# Patient Record
Sex: Male | Born: 1975 | Race: White | Hispanic: No | Smoking: Former smoker
Health system: Southern US, Community
[De-identification: ages and names within clinical notes are randomized; demographics above are authoritative.]

## PROBLEM LIST (undated history)

## (undated) DIAGNOSIS — K219 Gastro-esophageal reflux disease without esophagitis: Secondary | ICD-10-CM

## (undated) DIAGNOSIS — F419 Anxiety disorder, unspecified: Secondary | ICD-10-CM

## (undated) DIAGNOSIS — U071 COVID-19: Secondary | ICD-10-CM

## (undated) HISTORY — PX: HERNIA REPAIR: SHX51

---

## 2007-12-06 ENCOUNTER — Ambulatory Visit (HOSPITAL_BASED_OUTPATIENT_CLINIC_OR_DEPARTMENT_OTHER): Admission: RE | Admit: 2007-12-06 | Discharge: 2007-12-06 | Payer: Self-pay | Admitting: General Surgery

## 2010-12-23 NOTE — Op Note (Signed)
NAMEKEENA, HEESCH                 ACCOUNT NO.:  [id number]   MEDICAL RECORD NO.:  [id number]          PATIENT TYPE:  AMB   LOCATION:  DSC                          FACILITY:  [facility]   PHYSICIAN:  Cherylynn Ridges, M.D.    DATE OF BIRTH:  January 10, 1976   DATE OF PROCEDURE:  12/06/2007  DATE OF DISCHARGE:                               OPERATIVE REPORT   PREOPERATIVE DIAGNOSIS:  Right inguinal hernia.   POSTOPERATIVE DIAGNOSIS:  Direct right inguinal hernia.   PROCEDURE:  Right inguinal repair with mesh.   SURGEON:  Cherylynn Ridges, MD.   ANESTHESIA:  General with laryngeal airway.   ESTIMATED BLOOD LOSS:  Less than 20 mL.   COMPLICATIONS:  None.   CONDITION:  Stable.   FINDINGS:  Direct right inguinal hernia with small indirect sac.   INDICATIONS FOR OPERATION:  The patient is a 35 year old with  symptomatic right inguinal hernia who comes in now for repair.   OPERATION:  The patient was taken to the operating room, placed on table  in supine position.  After an adequate general laryngeal airway  anesthetic was administered, he was prepped and draped in usual sterile  manner exposing the right inguinal area.   A transverse curvilinear incision at the level of superficial ring was  made approximately 4.5-5 cm long with a #10 blade taken down to the  subcutaneous tissue.  Subcutaneous venous structures were ligated with 3-  0 Vicryl ties.  We isolated the external oblique fascia, then opened the  fascia longus fibers through the superficial ring exposing the spermatic  cord.  We mobilized the spermatic cord with Penrose drain.  I determined  there was only a very small indirect sac in the anteromedial aspect  which was suture ligated with 2-0 Ethibond sutures.  The major component  of the hernia was a direct component which we imbricated on itself with  interrupted 0 Ethibond sutures.  We then implanted a piece of oval mesh  measuring 4 x 2 cm in size attaching it to the conjoint  tendon  anteromedially and reflected a portion of inguinal ligament  inferolaterally.  This was done with running 0 Prolene sutures.  The  mesh had been soaked in an antibiotic solution prior to being implanted.   Once the mesh was in place, we irrigated with antibiotic solution, then  we closed the external oblique fascia protecting ilioinguinal nerve  using a running 3-0 Vicryl suture.  The Scarpa's fascia was  reapproximated using interrupted 3-0 Vicryl sutures.  Then, the skin was  closed using a running subcuticular suture of 4-0 Monocryl.  We  injected 0.5% Marcaine without epinephrine into the subcu and set a  regional block at the anterior superior iliac spine.  A total of 80 mL  were used.  All counts were correct.  Sterile dressing was applied  including Dermabond, Steri-Strips, and Tegaderm.      Cherylynn Ridges, M.D.  Electronically Signed     JOW/MEDQ  D:  12/06/2007  T:  12/06/2007  Job:  [id number]   cc:   Darcel Bayley  Beatris Si, M.D.

## 2014-01-12 ENCOUNTER — Encounter: Payer: Self-pay | Admitting: Family Medicine

## 2019-01-18 ENCOUNTER — Encounter (HOSPITAL_BASED_OUTPATIENT_CLINIC_OR_DEPARTMENT_OTHER): Payer: Self-pay | Admitting: *Deleted

## 2019-01-18 ENCOUNTER — Emergency Department (HOSPITAL_BASED_OUTPATIENT_CLINIC_OR_DEPARTMENT_OTHER): Payer: BC Managed Care – PPO

## 2019-01-18 ENCOUNTER — Other Ambulatory Visit: Payer: Self-pay

## 2019-01-18 ENCOUNTER — Emergency Department (HOSPITAL_BASED_OUTPATIENT_CLINIC_OR_DEPARTMENT_OTHER)
Admission: EM | Admit: 2019-01-18 | Discharge: 2019-01-18 | Disposition: A | Discharge status: Home / Self Care | Payer: BC Managed Care – PPO | Attending: Emergency Medicine | Admitting: Emergency Medicine

## 2019-01-18 DIAGNOSIS — Z79899 Other long term (current) drug therapy: Secondary | ICD-10-CM | POA: Insufficient documentation

## 2019-01-18 DIAGNOSIS — U071 COVID-19: Secondary | ICD-10-CM | POA: Diagnosis not present

## 2019-01-18 DIAGNOSIS — Z20822 Contact with and (suspected) exposure to covid-19: Secondary | ICD-10-CM

## 2019-01-18 DIAGNOSIS — Z87891 Personal history of nicotine dependence: Secondary | ICD-10-CM | POA: Insufficient documentation

## 2019-01-18 DIAGNOSIS — R509 Fever, unspecified: Secondary | ICD-10-CM

## 2019-01-18 DIAGNOSIS — M7918 Myalgia, other site: Secondary | ICD-10-CM | POA: Diagnosis present

## 2019-01-18 DIAGNOSIS — R52 Pain, unspecified: Secondary | ICD-10-CM

## 2019-01-18 MED ORDER — DOXYCYCLINE HYCLATE 100 MG PO CAPS: 100.0000 mg | ORAL_CAPSULE | Freq: Two times a day (BID) | ORAL | 0 refills | Status: DC

## 2019-01-18 MED ORDER — ACETAMINOPHEN 325 MG PO TABS
ORAL_TABLET | ORAL | Status: AC
  Filled 2019-01-18: qty 2

## 2019-01-18 MED ORDER — ACETAMINOPHEN 325 MG PO TABS
650.0000 mg | ORAL_TABLET | Freq: Once | ORAL | Status: AC | PRN
  Administered 2019-01-18: 650 mg via ORAL

## 2019-01-18 NOTE — ED Notes (Signed)
Pt on monitor 

## 2019-01-18 NOTE — Discharge Instructions (Signed)
You have presented with fever and body aches.  We do not see signs of acute bacterial infection on your history or physical exam. Your XRay shows no pneumonia or fluid. Our oxygen levels are normal.  Your fever may be secondary to a tick born illness such as rocky mountain spotted fever (which can occur without rash) or secondary to viral infection including possible COVID19.  We are giving you a prescription that treats tick-born illness (and would also treat possible bacterial sinus infecdtion) and are testing you for COVID19. We recommend self-quarantine for at least 7 days and 3 days after fever.  Return if you develop worsening symptoms or other concerns.

## 2019-01-18 NOTE — ED Provider Notes (Signed)
[place] EMERGENCY DEPARTMENT Provider Note   CSN: [id number] Arrival date & time: 01/18/19  1921    History   Chief Complaint Chief Complaint  Patient presents with  . Generalized Body Aches    HPI Victor Blanchard is a 43 y.o. male.     HPI   43 year old male with no significant medical history presents with concern for chills and body aches.  Reports that he began feeling unwell last week, but over the last 2 days his injuries have worsened.  Reports sensation of full body aches and full body tingling, chills, subjective fever.  Reports temperature was 99.1 yesterday.  Is febrile in the emergency department to 102.6.  His wife had similar symptoms last week and was diagnosed with a sinus infection the antibiotics.  She did not have COVID testing at that time.  Denies cough but does have sensation of some chest congestion.  Reports mild shortness of breath.  Denies urinary symptoms, abdominal pain, vomiting, diarrhea, loss of smell or taste.  Reports pain through some of his joints and overall aching.  Also reports headache.  Denies use of IV drugs.  Did note a tick crawling on him about a week ago, but no known tick bites.  No rash.  Chronic sinus congestion, and it is difficult to tell if the symptoms are any worse.  History reviewed. No pertinent past medical history.  There are no active problems to display for this patient.   Past Surgical History:  Procedure Laterality Date  . HERNIA REPAIR          Home Medications    Prior to Admission medications   Medication Sig Start Date End Date Taking? Authorizing Provider  dicyclomine (BENTYL) 20 MG tablet TAKE 1 TABLET FOUR TIMES A DAY 30 MINUTES BEFORE A MEAL 10/18/18  Yes [provider]  montelukast (SINGULAIR) 10 MG tablet TAKE 1 TABLET BY MOUTH EVERY DAY 01/10/18  Yes [provider]  omeprazole (PRILOSEC) 20 MG capsule TAKE 1 CAPSULE BY MOUTH EVERY DAY 07/25/18  Yes [provider]   doxycycline (VIBRAMYCIN) 100 MG capsule Take 1 capsule (100 mg total) by mouth 2 (two) times daily for 14 days. 01/18/19 02/01/19  Gareth Morgan, MD  loratadine (CLARITIN) 10 MG tablet Take by mouth.    [provider]    Family History No family history on file.  Social History Social History   Tobacco Use  . Smoking status: Former Research scientist (life sciences)  . Smokeless tobacco: Never Used  Substance Use Topics  . Alcohol use: Yes  . Drug use: Yes    Types: Marijuana     Allergies   Morphine and related   Review of Systems Review of Systems  Constitutional: Positive for activity change, appetite change, chills, fatigue and fever.  HENT: Positive for congestion, postnasal drip and rhinorrhea. Negative for sore throat.   Eyes: Negative for visual disturbance.  Respiratory: Positive for shortness of breath. Negative for cough.   Cardiovascular: Negative for chest pain.  Gastrointestinal: Negative for abdominal pain, diarrhea, nausea and vomiting.  Genitourinary: Negative for difficulty urinating and dysuria.  Musculoskeletal: Positive for arthralgias and myalgias. Negative for back pain and neck stiffness.  Skin: Negative for rash.  Neurological: Positive for headaches. Negative for syncope.     Physical Exam Updated Vital Signs BP 139/86   Pulse (!) 102   Temp 99.9 F (37.7 C) (Oral)   Resp 16   Ht 6\' 2"  (1.88 m)   Wt 106.6  kg   SpO2 97%   BMI 30.17 kg/m   Physical Exam Vitals signs and nursing note reviewed.  Constitutional:      General: He is not in acute distress.    Appearance: He is well-developed. He is not diaphoretic.  HENT:     Head: Normocephalic and atraumatic.     Comments: No oropharyngeal exudate No sinus tenderness Eyes:     Conjunctiva/sclera: Conjunctivae normal.  Neck:     Musculoskeletal: Normal range of motion.  Cardiovascular:     Rate and Rhythm: Regular rhythm. Tachycardia present.     Heart sounds: Normal heart sounds. No murmur. No  friction rub. No gallop.   Pulmonary:     Effort: Pulmonary effort is normal. No respiratory distress.     Breath sounds: Normal breath sounds. No wheezing or rales.  Abdominal:     General: There is no distension.     Palpations: Abdomen is soft.     Tenderness: There is no abdominal tenderness. There is no guarding.  Skin:    General: Skin is warm and dry.  Neurological:     Mental Status: He is alert and oriented to person, place, and time.      ED Treatments / Results  Labs (all labs ordered are listed, but only abnormal results are displayed) Labs Reviewed  NOVEL CORONAVIRUS, [facility](HOSPITAL ORDER, SEND-OUT TO REF LAB)    EKG EKG Interpretation  Date/Time:  Wednesday January 18 2019 20:38:37 EDT Ventricular Rate:  104 PR Interval:    QRS Duration: 86 QT Interval:  315 QTC Calculation: 415 R Axis:   10 Text Interpretation:  Sinus tachycardia Abnormal R-wave progression, early transition No previous ECGs available Confirmed by Alvira MondaySchlossman, Shloka Baldridge ([id number]) on 01/18/2019 9:11:17 PM Also confirmed by Alvira MondaySchlossman, Ranier Coach ([id number]), editor Barbette Hairassel, Kerry [phone]([id number])  on 01/19/2019 7:06:52 AM   Radiology Dg Chest Portable 1 View  Result Date: 01/18/2019 CLINICAL DATA:  Chills and body aches EXAM: PORTABLE CHEST 1 VIEW COMPARISON:  None. FINDINGS: The heart size and mediastinal contours are within normal limits. Both lungs are clear. The visualized skeletal structures are unremarkable. IMPRESSION: No active disease. Electronically Signed   By: Katherine Mantlehristopher  Green M.D.   On: 01/18/2019 20:55    Procedures Procedures (including critical care time)  Medications Ordered in ED Medications  acetaminophen (TYLENOL) tablet 650 mg (650 mg Oral Given 01/18/19 1958)     Initial Impression / Assessment and Plan / ED Course  I have reviewed the triage vital signs and the nursing notes.  Pertinent labs & imaging results that were available during my care of the patient were reviewed by me and considered  in my medical decision making (see chart for details).        43 year old male with no significant asthma history presents with concern for body aches and chills, was found to be febrile to 102.6 on arrival to the emergency department.  Chest x-ray shows no evidence of pneumonia.  He has not had nausea, vomiting, diarrhea, and have low suspicion for significant electrolyte abnormalities that would require labwork.  Doubt meningitis as he has normal mentation, is freely flexing and extending neck and turning head without signs of nuchal rigidity.  No urinary symptoms to suggest UTI.  He does not have abdominal pain to suggest intra-abdominal cause of his fever.  No rash or history of IV drug use.  He does not have sore throat or signs to suggest strep throat on exam.  Given history of potential  tick exposures and symptoms, will empirically treat for possible tickborne illness with doxycycline for 2 weeks.  Given patient questions about strep swab and bacterial sinus disease I discussed that doxycycline would also empirically treat these although I have a very low clinical suspicion both.  Discussed given wife is sick contact having similar symptoms in the setting of COVID-19 pandemic, I high concern that his symptoms are secondary to COVID-19. Recommend self-quarantine, supportive care and discussed reasons to return.      Michelene GardenerJason Plemons was evaluated in Emergency Department on 01/18/2019 for the symptoms described in the history of present illness. He/she was evaluated in the context of the global COVID-19 pandemic, which necessitated consideration that the patient might be at risk for infection with the SARS-CoV-2 virus that causes COVID-19. Institutional protocols and algorithms that pertain to the evaluation of patients at risk for COVID-19 are in a state of rapid change based on information released by regulatory bodies including the CDC and federal and state organizations. These policies and algorithms  were followed during the patient's care in the ED.   Final Clinical Impressions(s) / ED Diagnoses   Final diagnoses:  Body aches  Fever, unspecified fever cause  Suspected Covid-19 Virus Infection  Tick exposure  ED Discharge Orders         Ordered    doxycycline (VIBRAMYCIN) 100 MG capsule  2 times daily     01/18/19 2117           Alvira MondaySchlossman, Vonnie Spagnolo, MD 01/19/19 1209

## 2019-01-18 NOTE — ED Notes (Signed)
ED Provider at bedside. 

## 2019-01-18 NOTE — ED Triage Notes (Signed)
Chills and body aches x 2 days. States his wife has similar sx. Temp 99.1 yesterday. Denies cough, but states he has some sinus drainage

## 2019-01-21 ENCOUNTER — Telehealth (HOSPITAL_BASED_OUTPATIENT_CLINIC_OR_DEPARTMENT_OTHER): Payer: Self-pay | Admitting: Emergency Medicine

## 2019-01-24 LAB — NOVEL CORONAVIRUS, NAA (HOSP ORDER, SEND-OUT TO REF LAB; TAT 18-24 HRS): SARS-CoV-2, NAA: DETECTED — AB

## 2019-01-28 ENCOUNTER — Emergency Department (HOSPITAL_COMMUNITY): Payer: BC Managed Care – PPO

## 2019-01-28 ENCOUNTER — Encounter (HOSPITAL_COMMUNITY): Payer: Self-pay | Admitting: Emergency Medicine

## 2019-01-28 ENCOUNTER — Other Ambulatory Visit: Payer: Self-pay

## 2019-01-28 ENCOUNTER — Inpatient Hospital Stay (HOSPITAL_COMMUNITY)
Admission: EM | Admit: 2019-01-28 | Discharge: 2019-02-02 | DRG: 177 | Disposition: A | Discharge status: Home / Self Care | Payer: BC Managed Care – PPO | Attending: Family Medicine | Admitting: Family Medicine

## 2019-01-28 DIAGNOSIS — U071 COVID-19: Principal | ICD-10-CM | POA: Diagnosis present

## 2019-01-28 DIAGNOSIS — E871 Hypo-osmolality and hyponatremia: Secondary | ICD-10-CM | POA: Diagnosis present

## 2019-01-28 DIAGNOSIS — J069 Acute upper respiratory infection, unspecified: Secondary | ICD-10-CM

## 2019-01-28 DIAGNOSIS — R0902 Hypoxemia: Secondary | ICD-10-CM | POA: Diagnosis not present

## 2019-01-28 DIAGNOSIS — Z87891 Personal history of nicotine dependence: Secondary | ICD-10-CM | POA: Diagnosis not present

## 2019-01-28 DIAGNOSIS — J45909 Unspecified asthma, uncomplicated: Secondary | ICD-10-CM | POA: Diagnosis not present

## 2019-01-28 DIAGNOSIS — J1289 Other viral pneumonia: Secondary | ICD-10-CM | POA: Diagnosis not present

## 2019-01-28 DIAGNOSIS — T148XXA Other injury of unspecified body region, initial encounter: Secondary | ICD-10-CM | POA: Diagnosis not present

## 2019-01-28 DIAGNOSIS — Z79899 Other long term (current) drug therapy: Secondary | ICD-10-CM | POA: Diagnosis not present

## 2019-01-28 DIAGNOSIS — Z6829 Body mass index (BMI) 29.0-29.9, adult: Secondary | ICD-10-CM

## 2019-01-28 DIAGNOSIS — E669 Obesity, unspecified: Secondary | ICD-10-CM | POA: Diagnosis not present

## 2019-01-28 DIAGNOSIS — E876 Hypokalemia: Secondary | ICD-10-CM | POA: Diagnosis present

## 2019-01-28 DIAGNOSIS — R0600 Dyspnea, unspecified: Secondary | ICD-10-CM | POA: Diagnosis present

## 2019-01-28 DIAGNOSIS — E872 Acidosis: Secondary | ICD-10-CM | POA: Diagnosis not present

## 2019-01-28 DIAGNOSIS — F1010 Alcohol abuse, uncomplicated: Secondary | ICD-10-CM | POA: Diagnosis not present

## 2019-01-28 DIAGNOSIS — W57XXXA Bitten or stung by nonvenomous insect and other nonvenomous arthropods, initial encounter: Secondary | ICD-10-CM | POA: Diagnosis present

## 2019-01-28 DIAGNOSIS — Z885 Allergy status to narcotic agent status: Secondary | ICD-10-CM | POA: Diagnosis not present

## 2019-01-28 HISTORY — DX: COVID-19: U07.1

## 2019-01-28 HISTORY — DX: Gastro-esophageal reflux disease without esophagitis: K21.9

## 2019-01-28 HISTORY — DX: Anxiety disorder, unspecified: F41.9

## 2019-01-28 LAB — COMPREHENSIVE METABOLIC PANEL
ALT: 33 U/L (ref 0–44)
AST: 32 U/L (ref 15–41)
Albumin: 3.9 g/dL (ref 3.5–5.0)
Alkaline Phosphatase: 64 U/L (ref 38–126)
Anion gap: 13 (ref 5–15)
BUN: 23 mg/dL — ABNORMAL HIGH (ref 6–20)
CO2: 19 mmol/L — ABNORMAL LOW (ref 22–32)
Calcium: 8.8 mg/dL — ABNORMAL LOW (ref 8.9–10.3)
Chloride: 99 mmol/L (ref 98–111)
Creatinine, Ser: 1.24 mg/dL (ref 0.61–1.24)
GFR calc Af Amer: >60 mL/min (ref >60)
GFR calc non Af Amer: >60 mL/min (ref >60)
Glucose, Bld: 117 mg/dL — ABNORMAL HIGH (ref 70–99)
Potassium: 3.1 mmol/L — ABNORMAL LOW (ref 3.5–5.1)
Sodium: 131 mmol/L — ABNORMAL LOW (ref 135–145)
Total Bilirubin: 1.3 mg/dL — ABNORMAL HIGH (ref 0.3–1.2)
Total Protein: 8 g/dL (ref 6.5–8.1)

## 2019-01-28 LAB — URINALYSIS, ROUTINE W REFLEX MICROSCOPIC
Bilirubin Urine: NEGATIVE
Glucose, UA: NEGATIVE mg/dL
Ketones, ur: 5 mg/dL — AB
Leukocytes,Ua: NEGATIVE
Nitrite: NEGATIVE
Protein, ur: 30 mg/dL — AB
Specific Gravity, Urine: 1.031 — ABNORMAL HIGH (ref 1.005–1.030)
pH: 5 (ref 5.0–8.0)

## 2019-01-28 LAB — LACTIC ACID, PLASMA: Lactic Acid, Venous: 1.2 mmol/L (ref 0.5–1.9)

## 2019-01-28 LAB — LACTATE DEHYDROGENASE: LDH: 270 U/L — ABNORMAL HIGH (ref 98–192)

## 2019-01-28 LAB — CBC WITH DIFFERENTIAL/PLATELET
Abs Immature Granulocytes: 0.06 10*3/uL (ref 0.00–0.07)
Basophils Absolute: 0 10*3/uL (ref 0.0–0.1)
Basophils Relative: 0 %
Eosinophils Absolute: 0 10*3/uL (ref 0.0–0.5)
Eosinophils Relative: 0 %
HCT: 43.4 % (ref 39.0–52.0)
Hemoglobin: 15.3 g/dL (ref 13.0–17.0)
Immature Granulocytes: 1 %
Lymphocytes Relative: 11 %
Lymphs Abs: 1.1 10*3/uL (ref 0.7–4.0)
MCH: 29.8 pg (ref 26.0–34.0)
MCHC: 35.3 g/dL (ref 30.0–36.0)
MCV: 84.4 fL (ref 80.0–100.0)
Monocytes Absolute: 0.7 10*3/uL (ref 0.1–1.0)
Monocytes Relative: 7 %
Neutro Abs: 8.1 10*3/uL — ABNORMAL HIGH (ref 1.7–7.7)
Neutrophils Relative %: 81 %
Platelets: 205 10*3/uL (ref 150–400)
RBC: 5.14 MIL/uL (ref 4.22–5.81)
RDW: 12.1 % (ref 11.5–15.5)
WBC: 10 10*3/uL (ref 4.0–10.5)
nRBC: 0 % (ref 0.0–0.2)

## 2019-01-28 LAB — D-DIMER, QUANTITATIVE: D-Dimer, Quant: 0.37 ug/mL-FEU (ref 0.00–0.50)

## 2019-01-28 LAB — C-REACTIVE PROTEIN: CRP: >20 mg/dL — ABNORMAL HIGH (ref <1.0)

## 2019-01-28 LAB — SEDIMENTATION RATE: Sed Rate: 53 mm/hr — ABNORMAL HIGH (ref 0–16)

## 2019-01-28 LAB — FERRITIN: Ferritin: 639 ng/mL — ABNORMAL HIGH (ref 24–336)

## 2019-01-28 MED ORDER — ZINC SULFATE 220 (50 ZN) MG PO CAPS
220.0000 mg | ORAL_CAPSULE | Freq: Every day | ORAL | Status: DC
  Administered 2019-01-29 – 2019-02-02 (×5): 220 mg via ORAL
  Filled 2019-01-28 (×7): qty 1

## 2019-01-28 MED ORDER — ACETAMINOPHEN 325 MG PO TABS
650.0000 mg | ORAL_TABLET | Freq: Four times a day (QID) | ORAL | Status: DC | PRN
  Administered 2019-01-28 – 2019-01-31 (×5): 650 mg via ORAL
  Filled 2019-01-28 (×5): qty 2

## 2019-01-28 MED ORDER — MONTELUKAST SODIUM 10 MG PO TABS
10.0000 mg | ORAL_TABLET | Freq: Every day | ORAL | Status: DC
  Administered 2019-01-28 – 2019-02-02 (×6): 10 mg via ORAL
  Filled 2019-01-28 (×6): qty 1

## 2019-01-28 MED ORDER — SODIUM CHLORIDE 0.9 % IV BOLUS
1000.0000 mL | Freq: Once | INTRAVENOUS | Status: AC
  Administered 2019-01-28: 1000 mL via INTRAVENOUS

## 2019-01-28 MED ORDER — ONDANSETRON HCL 4 MG/2ML IJ SOLN
4.0000 mg | Freq: Four times a day (QID) | INTRAMUSCULAR | Status: DC | PRN
  Administered 2019-01-29: 4 mg via INTRAVENOUS
  Filled 2019-01-28: qty 2

## 2019-01-28 MED ORDER — POTASSIUM CHLORIDE CRYS ER 20 MEQ PO TBCR
40.0000 meq | EXTENDED_RELEASE_TABLET | Freq: Once | ORAL | Status: AC
  Administered 2019-01-28: 40 meq via ORAL
  Filled 2019-01-28: qty 2

## 2019-01-28 MED ORDER — GUAIFENESIN-DM 100-10 MG/5ML PO SYRP: 10.0000 mL | ORAL_SOLUTION | ORAL | Status: DC | PRN

## 2019-01-28 MED ORDER — SODIUM CHLORIDE 0.9% FLUSH
3.0000 mL | INTRAVENOUS | Status: DC | PRN
  Administered 2019-01-29: 3 mL via INTRAVENOUS
  Filled 2019-01-28: qty 3

## 2019-01-28 MED ORDER — SODIUM CHLORIDE 0.9% FLUSH
3.0000 mL | Freq: Two times a day (BID) | INTRAVENOUS | Status: DC
  Administered 2019-01-28 – 2019-01-30 (×2): 3 mL via INTRAVENOUS

## 2019-01-28 MED ORDER — PANTOPRAZOLE SODIUM 40 MG PO TBEC
40.0000 mg | DELAYED_RELEASE_TABLET | Freq: Every day | ORAL | Status: DC
  Administered 2019-01-29 – 2019-02-02 (×5): 40 mg via ORAL
  Filled 2019-01-28 (×5): qty 1

## 2019-01-28 MED ORDER — SENNA 8.6 MG PO TABS: 1.0000 | ORAL_TABLET | Freq: Every day | ORAL | Status: DC

## 2019-01-28 MED ORDER — SODIUM CHLORIDE 0.9 % IV SOLN: 250.0000 mL | INTRAVENOUS | Status: DC | PRN

## 2019-01-28 MED ORDER — ALBUTEROL SULFATE HFA 108 (90 BASE) MCG/ACT IN AERS
2.0000 | INHALATION_SPRAY | RESPIRATORY_TRACT | Status: DC | PRN
  Administered 2019-01-28 – 2019-02-01 (×5): 2 via RESPIRATORY_TRACT
  Filled 2019-01-28: qty 6.7

## 2019-01-28 MED ORDER — SODIUM CHLORIDE 0.9% FLUSH: 3.0000 mL | Freq: Two times a day (BID) | INTRAVENOUS | Status: DC

## 2019-01-28 MED ORDER — VITAMIN C 500 MG PO TABS
500.0000 mg | ORAL_TABLET | Freq: Every day | ORAL | Status: DC
  Administered 2019-01-29 – 2019-02-02 (×5): 500 mg via ORAL
  Filled 2019-01-28 (×7): qty 1

## 2019-01-28 MED ORDER — ALBUTEROL SULFATE HFA 108 (90 BASE) MCG/ACT IN AERS
2.0000 | INHALATION_SPRAY | Freq: Four times a day (QID) | RESPIRATORY_TRACT | Status: DC
  Administered 2019-01-28 – 2019-01-29 (×4): 2 via RESPIRATORY_TRACT
  Filled 2019-01-28: qty 6.7

## 2019-01-28 MED ORDER — ONDANSETRON HCL 4 MG PO TABS: 4.0000 mg | ORAL_TABLET | Freq: Four times a day (QID) | ORAL | Status: DC | PRN

## 2019-01-28 MED ORDER — ENOXAPARIN SODIUM 40 MG/0.4ML ~~LOC~~ SOLN
40.0000 mg | SUBCUTANEOUS | Status: DC
  Administered 2019-01-28 – 2019-02-01 (×5): 40 mg via SUBCUTANEOUS
  Filled 2019-01-28 (×5): qty 0.4

## 2019-01-28 MED ORDER — LORATADINE 10 MG PO TABS
10.0000 mg | ORAL_TABLET | Freq: Every day | ORAL | Status: DC
  Administered 2019-01-29 – 2019-02-02 (×5): 10 mg via ORAL
  Filled 2019-01-28 (×6): qty 1

## 2019-01-28 NOTE — ED Notes (Signed)
Patient does not appear to be in any respiratory distress at this time. Oxygen saturation is 100% on room air. No labored breathing or dyspnea noted. Patient states his chest pain has decreased with a breathing treatment.

## 2019-01-28 NOTE — ED Notes (Signed)
Patient is resting comfortably. 

## 2019-01-28 NOTE — ED Triage Notes (Signed)
Pt reports being positive for covid a few days ago and has been self quarantined. Has begun to develop sob with temp up to 103 this morning. Took tylenol pta.

## 2019-01-28 NOTE — H&P (Signed)
[facility] History and Physical   Patient: Victor Blanchard BTD:[id number]   PCP: Dione Housekeeper, MD DOB: 25-Jun-1976   DOA: 01/28/2019   DOS: 01/28/2019   DOS: the patient was seen and examined on 01/28/2019  Patient coming from: The patient is coming from home  Chief Complaint: Fever nausea vomiting diarrhea, brought in by ambulance  HPI: Victor Blanchard is a 43 y.o. male with Past medical history of alcohol abuse. Patient presents with complaints of nausea vomiting and diarrhea. This is ongoing since last few days. Patient was Recently seen in ER on 01/18/2019 with body ache tingling and chills.  His wife had similar symptoms last week.  Patient's COVID test was positive.  Due to mild symptoms patient was recommended to quarantine at home. Patient was self quarantining at his parents place. Since last 2 days patient started having complaints of worsening shortness of breath continues fever as well as nausea vomiting and diarrhea. He also had some lower abdominal pain which is currently resolved. No blood in stool or vomit. Patient unable to eat or drink anything for last couple of days. Patient drinks 6 cans of beers on a daily basis but since 01/24/2019 he has not been able to drink any beer. Denies any active smoking. No drug abuse reported.  ED Course: Presents with temp 101, T-max 103, sinus tachycardia, tachypnea.  CRP 20 LDH ferritin ESR elevated.  Patient was referred for admission.  At his baseline ambulates without support And is independent for most of his ADL; manages his medication on his own.  Review of Systems: as mentioned in the history of present illness.  All other systems reviewed and are negative.  Past Medical History:  Diagnosis Date  . COVID-19    Past Surgical History:  Procedure Laterality Date  . HERNIA REPAIR     Social History:  reports that he has quit smoking. He has never used smokeless tobacco. He reports current alcohol use. He reports current  drug use. Drug: Marijuana.  Allergies  Allergen Reactions  . Morphine And Related Nausea And Vomiting    Family History  Problem Relation Age of Onset  . Dementia Mother      Prior to Admission medications   Medication Sig Start Date End Date Taking? Authorizing Provider  acetaminophen (TYLENOL 8 HOUR ARTHRITIS PAIN) 650 MG CR tablet Take 1,300 mg by mouth every 8 (eight) hours as needed for pain.   Yes [provider]  dicyclomine (BENTYL) 20 MG tablet TAKE 1 TABLET FOUR TIMES A DAY 30 MINUTES BEFORE A MEAL 10/18/18  Yes [provider]  doxycycline (VIBRAMYCIN) 100 MG capsule Take 1 capsule (100 mg total) by mouth 2 (two) times daily for 14 days. 01/18/19 02/01/19 Yes Gareth Morgan, MD  loratadine (CLARITIN) 10 MG tablet Take 10 mg by mouth daily.    Yes [provider]  montelukast (SINGULAIR) 10 MG tablet TAKE 1 TABLET BY MOUTH EVERY DAY 01/10/18  Yes [provider]  omeprazole (PRILOSEC) 20 MG capsule TAKE 1 CAPSULE BY MOUTH EVERY DAY 07/25/18  Yes [provider]    Physical Exam: Vitals:   01/28/19 1200 01/28/19 1258 01/28/19 1419 01/28/19 1612  BP: (!) 148/82  (!) 152/84 (!) 145/100  Pulse: 97 86 (!) 106 100  Resp:   (!) 21 18  Temp:   100.1 F (37.8 C) (!) 103 F (39.4 C)  TempSrc:   Oral Oral  SpO2: 97% 98% 100% 100%  Weight:  Height:        General: Alert, Awake and Oriented to Time, Place and Person. Appear in marked distress, affect appropriate Eyes: PERRL, Conjunctiva normal ENT: Oral Mucosa clear dry. Neck: no JVD, no Abnormal Mass Or lumps Cardiovascular: S1 and S2 Present, no Murmur, Peripheral Pulses Present Respiratory: Increased respiratory effort, Bilateral Air entry equal and Decreased, no use of accessory muscle, Clear to Auscultation, no Crackles, no wheezes Abdomen: Bowel Sound present, Soft and no tenderness, no hernia Skin: no redness, no Rash, no induration Extremities: no Pedal edema, no calf  tenderness Neurologic: Grossly no focal neuro deficit. Bilaterally Equal motor strength  Labs on Admission:  CBC: Recent Labs  Lab 01/28/19 1047  WBC 10.0  NEUTROABS 8.1*  HGB 15.3  HCT 43.4  MCV 84.4  PLT 161   Basic Metabolic Panel: Recent Labs  Lab 01/28/19 1047  NA 131*  K 3.1*  CL 99  CO2 19*  GLUCOSE 117*  BUN 23*  CREATININE 1.24  CALCIUM 8.8*   GFR: Estimated Creatinine Clearance: 100.9 mL/min (by C-G formula based on SCr of 1.24 mg/dL). Liver Function Tests: Recent Labs  Lab 01/28/19 1047  AST 32  ALT 33  ALKPHOS 64  BILITOT 1.3*  PROT 8.0  ALBUMIN 3.9   No results for input(s): LIPASE, AMYLASE in the last 168 hours. No results for input(s): AMMONIA in the last 168 hours. Coagulation Profile: No results for input(s): INR, PROTIME in the last 168 hours. Cardiac Enzymes: No results for input(s): CKTOTAL, CKMB, CKMBINDEX, TROPONINI in the last 168 hours. BNP (last 3 results) No results for input(s): PROBNP in the last 8760 hours. HbA1C: No results for input(s): HGBA1C in the last 72 hours. CBG: No results for input(s): GLUCAP in the last 168 hours. Lipid Profile: No results for input(s): CHOL, HDL, LDLCALC, TRIG, CHOLHDL, LDLDIRECT in the last 72 hours. Thyroid Function Tests: No results for input(s): TSH, T4TOTAL, FREET4, T3FREE, THYROIDAB in the last 72 hours. Anemia Panel: Recent Labs    01/28/19 1024  FERRITIN 639*   Urine analysis:    Component Value Date/Time   COLORURINE AMBER (A) 01/28/2019 1401   APPEARANCEUR HAZY (A) 01/28/2019 1401   LABSPEC 1.031 (H) 01/28/2019 1401   PHURINE 5.0 01/28/2019 1401   GLUCOSEU NEGATIVE 01/28/2019 1401   HGBUR SMALL (A) 01/28/2019 1401   BILIRUBINUR NEGATIVE 01/28/2019 1401   KETONESUR 5 (A) 01/28/2019 1401   PROTEINUR 30 (A) 01/28/2019 1401   NITRITE NEGATIVE 01/28/2019 1401   LEUKOCYTESUR NEGATIVE 01/28/2019 1401    Radiological Exams on Admission: Dg Chest Portable 1 View  Result Date:  01/28/2019 CLINICAL DATA:  COVID-19 positive.  Increased shortness of breath. EXAM: PORTABLE CHEST 1 VIEW COMPARISON:  01/18/2019 FINDINGS: Grossly unchanged cardiac silhouette and mediastinal contours with persistent thickening of the right paratracheal stripe. Interval development heterogeneous airspace opacity with the peripheral aspect the right upper lung. Ill-defined nodular airspace opacities are seen with the peripheral aspect of the left mid and lower lung. No pleural effusion or pneumothorax. No evidence of edema. No acute osseus abnormalities. IMPRESSION: Findings worrisome for development of multifocal infection most severely affecting the peripheral aspect of the right upper lung, compatible with provided history of patient's COVID-19 positivity. Electronically Signed   By: Sandi Mariscal M.D.   On: 01/28/2019 13:14    Assessment/Plan 1. Acute COVID-19 Viral illness Lab Results  Component Value Date   SARSCOV2NAA DETECTED (A) 01/18/2019   RVP not done No lymphopenia, normal LFT, Procalcitonin not checked,  CXR: hazy bilateral peripheral opacities  Recent Labs    01/28/19 1024 01/28/19 1047  DDIMER  --  0.37  FERRITIN 639*  --   LDH  --  270*  CRP >20.0*  --     Tmax: 103 Oxygen requirements: 2 LPM just for comfort  Antibiotics: None Diuretics: Currently not indicated, at risk for dehydration with nausea vomiting and diarrhea Vitamin C and Zinc: Ordered DVT Prophylaxis: Subcutaneous Lovenox Remdesivir: Currently not indicated Steroids: Holding at present Actemra: Currently not indicated  Prone positioning: Patient encouraged to stay in prone position as much as possible.  During this encounter: Patient Isolation: Droplet + Contact HCP PPE: Surgical mask, face shield, gown, gloves Patient PPE: Surgical mask  The treatment plan and use of medications and known side effects were discussed with patient/family. It was clearly explained that there is no proven definitive  treatment for COVID-19 infection yet. Any medications used here are based on case reports/anecdotal data which are not peer-reviewed and has not been studied using randomized control trials.  Complete risks and long-term side effects are unknown, however in the best clinical judgment they seem to be of some clinical benefit rather than medical risks.  Patient/family agree with the treatment plan and want to receive these treatments as indicated.   2.  Alcohol abuse. Drinks 6 cans of beer every day. Last drink was 01/24/2019.  Just had 1 beer that day. Continue CIWA protocol. Continue folic acid and thiamine. Currently does not appear to be withdrawing.  3.  Diarrhea. Present on admission. Related to COVID illness. 3-4 on a daily basis loose watery. No abdominal pain at the time of my evaluation. Monitor for now.  No further work-up for now.  4.  Nausea and vomiting. Multiple episodes every day ongoing for last few days. Reports dry heaving today. Continue PRN antiemetic. If continues to have further nausea and vomiting at risk for dehydration may require IV hydration.  Nutrition: Regular diet DVT Prophylaxis: Subcutaneous Lovenox  Advance goals of care discussion: Full code  Consults: none  Family Communication: no family was present at bedside, at the time of interview.  Patient provided permission to discuss with spouse. Opportunity was given to ask question and all questions were answered satisfactorily.  Disposition: Admitted as observation, tmed surge unit. Likely to be discharged home, in 2 days.  Severity of Illness: The appropriate patient status for this patient is OBSERVATION. Observation status is judged to be reasonable and necessary in order to provide the required intensity of service to ensure the patient's safety. The patient's presenting symptoms, physical exam findings, and initial radiographic and laboratory data in the context of their medical condition is felt  to place them at decreased risk for further clinical deterioration. Furthermore, it is anticipated that the patient will be medically stable for discharge from the hospital within 2 midnights of admission. The following factors support the patient status of observation.   " The patient's presenting symptoms include nausea vomiting diarrhea, fever related to COVID illness. " The physical exam findings include tachycardia, tachypnea, temp 103. " The initial radiographic and laboratory data are CRP more than 20, ferritin 639, elevated BUN, hypokalemia, hyponatremia.     Author: Berle Mull, MD [facility] 01/28/2019  To reach On-call, see care teams to locate the attending and reach out to them via [email or web address]. If 7PM-7AM, please contact night-coverage If you still have difficulty reaching the attending provider, please page the [facility] (Director on Call) for [facility] on [employer]  for assistance.

## 2019-01-28 NOTE — ED Notes (Signed)
Patient given water per MD approval at this time. RN notified of temperature.

## 2019-01-28 NOTE — ED Notes (Signed)
Called [facility] for transport to [facility]. Per Ruby, "they are down a truck, and it will be awhile".  CN informed.

## 2019-01-28 NOTE — ED Provider Notes (Addendum)
[facility] EMERGENCY DEPARTMENT Provider Note   CSN: [id number] Arrival date & time: 01/28/19  1025     History   Chief Complaint Chief Complaint  Patient presents with  . Shortness of Breath    HPI Victor Blanchard is a 43 y.o. male.     Patient complains of shortness of breath.  He is positive for COVID 10 days ago  The history is provided by the patient. No language interpreter was used.  Shortness of Breath Severity:  Moderate Onset quality:  Gradual Timing:  Constant Progression:  Worsening Chronicity:  Recurrent Context: activity   Relieved by:  Nothing Worsened by:  Nothing Associated symptoms: no abdominal pain, no chest pain, no cough, no headaches and no rash     Past Medical History:  Diagnosis Date  . COVID-19     There are no active problems to display for this patient.   Past Surgical History:  Procedure Laterality Date  . HERNIA REPAIR          Home Medications    Prior to Admission medications   Medication Sig Start Date End Date Taking? Authorizing Provider  acetaminophen (TYLENOL 8 HOUR ARTHRITIS PAIN) 650 MG CR tablet Take 1,300 mg by mouth every 8 (eight) hours as needed for pain.   Yes [provider]  dicyclomine (BENTYL) 20 MG tablet TAKE 1 TABLET FOUR TIMES A DAY 30 MINUTES BEFORE A MEAL 10/18/18  Yes [provider]  doxycycline (VIBRAMYCIN) 100 MG capsule Take 1 capsule (100 mg total) by mouth 2 (two) times daily for 14 days. 01/18/19 02/01/19 Yes Alvira MondaySchlossman, Erin, MD  loratadine (CLARITIN) 10 MG tablet Take 10 mg by mouth daily.    Yes [provider]  montelukast (SINGULAIR) 10 MG tablet TAKE 1 TABLET BY MOUTH EVERY DAY 01/10/18  Yes [provider]  omeprazole (PRILOSEC) 20 MG capsule TAKE 1 CAPSULE BY MOUTH EVERY DAY 07/25/18  Yes [provider]    Family History History reviewed. No pertinent family history.  Social History Social History   Tobacco Use  . Smoking status: Former  Games developermoker  . Smokeless tobacco: Never Used  Substance Use Topics  . Alcohol use: Yes    Comment: daily  . Drug use: Yes    Types: Marijuana     Allergies   Morphine and related   Review of Systems Review of Systems  Constitutional: Negative for appetite change and fatigue.  HENT: Negative for congestion, ear discharge and sinus pressure.   Eyes: Negative for discharge.  Respiratory: Positive for shortness of breath. Negative for cough.   Cardiovascular: Negative for chest pain.  Gastrointestinal: Negative for abdominal pain and diarrhea.  Genitourinary: Negative for frequency and hematuria.  Musculoskeletal: Negative for back pain.  Skin: Negative for rash.  Neurological: Negative for seizures and headaches.  Psychiatric/Behavioral: Negative for hallucinations.  Patient complains of   Physical Exam Updated Vital Signs BP (!) 152/84 (BP Location: Left Arm)   Pulse (!) 106   Temp 100.1 F (37.8 C) (Oral)   Resp (!) 21   Ht 6\' 2"  (1.88 m)   Wt 104.3 kg   SpO2 100%   BMI 29.53 kg/m   Physical Exam Vitals signs and nursing note reviewed.  Constitutional:      Appearance: He is well-developed.  HENT:     Head: Normocephalic.     Mouth/Throat:     Mouth: Mucous membranes are moist.  Eyes:     General: No scleral icterus.  Conjunctiva/sclera: Conjunctivae normal.  Neck:     Musculoskeletal: Neck supple.     Thyroid: No thyromegaly.  Cardiovascular:     Rate and Rhythm: Normal rate and regular rhythm.     Heart sounds: No murmur. No friction rub. No gallop.   Pulmonary:     Breath sounds: No stridor. No wheezing or rales.  Chest:     Chest wall: No tenderness.  Abdominal:     General: There is no distension.     Tenderness: There is no abdominal tenderness. There is no rebound.  Musculoskeletal: Normal range of motion.  Lymphadenopathy:     Cervical: No cervical adenopathy.  Skin:    Findings: No erythema or rash.  Neurological:     Mental Status: He is  oriented to person, place, and time.     Motor: No abnormal muscle tone.     Coordination: Coordination normal.  Psychiatric:        Behavior: Behavior normal.      ED Treatments / Results  Labs (all labs ordered are listed, but only abnormal results are displayed) Labs Reviewed  CBC WITH DIFFERENTIAL/PLATELET - Abnormal; Notable for the following components:      Result Value   Neutro Abs 8.1 (*)    All other components within normal limits  COMPREHENSIVE METABOLIC PANEL - Abnormal; Notable for the following components:   Sodium 131 (*)    Potassium 3.1 (*)    CO2 19 (*)    Glucose, Bld 117 (*)    BUN 23 (*)    Calcium 8.8 (*)    Total Bilirubin 1.3 (*)    All other components within normal limits  LACTIC ACID, PLASMA  URINALYSIS, ROUTINE W REFLEX MICROSCOPIC  C-REACTIVE PROTEIN  SEDIMENTATION RATE  LACTATE DEHYDROGENASE  D-DIMER, QUANTITATIVE (NOT AT [facility])  FERRITIN    EKG    Radiology Dg Chest Portable 1 View  Result Date: 01/28/2019 CLINICAL DATA:  COVID-19 positive.  Increased shortness of breath. EXAM: PORTABLE CHEST 1 VIEW COMPARISON:  01/18/2019 FINDINGS: Grossly unchanged cardiac silhouette and mediastinal contours with persistent thickening of the right paratracheal stripe. Interval development heterogeneous airspace opacity with the peripheral aspect the right upper lung. Ill-defined nodular airspace opacities are seen with the peripheral aspect of the left mid and lower lung. No pleural effusion or pneumothorax. No evidence of edema. No acute osseus abnormalities. IMPRESSION: Findings worrisome for development of multifocal infection most severely affecting the peripheral aspect of the right upper lung, compatible with provided history of patient's COVID-19 positivity. Electronically Signed   By: Simonne ComeJohn  Watts M.D.   On: 01/28/2019 13:14   .[email or web address]  Procedures Procedures (including critical care time)  Medications Ordered in ED Medications  albuterol  (VENTOLIN HFA) 108 (90 Base) MCG/ACT inhaler 2 puff (2 puffs Inhalation Given 01/28/19 1133)  sodium chloride 0.9 % bolus 1,000 mL (0 mLs Intravenous Stopped 01/28/19 1257)  potassium chloride SA (K-DUR) CR tablet 40 mEq (40 mEq Oral Given 01/28/19 1257)     Initial Impression / Assessment and Plan / ED Course  I have reviewed the triage vital signs and the nursing notes.  Pertinent labs & imaging results that were available during my care of the patient were reviewed by me and considered in my medical decision making (see chart for details).    Patient with positive COVID and shortness of breath.  He will be evaluated by the hospitalist     Final Clinical Impressions(s) / ED Diagnoses   Final diagnoses:  None    ED Discharge Orders    None       Milton Ferguson, MD 01/28/19 1439    Milton Ferguson, MD 01/28/19 [phone]

## 2019-01-29 ENCOUNTER — Other Ambulatory Visit: Payer: Self-pay

## 2019-01-29 ENCOUNTER — Encounter (HOSPITAL_COMMUNITY): Payer: Self-pay

## 2019-01-29 DIAGNOSIS — T148XXA Other injury of unspecified body region, initial encounter: Secondary | ICD-10-CM | POA: Diagnosis present

## 2019-01-29 DIAGNOSIS — E669 Obesity, unspecified: Secondary | ICD-10-CM | POA: Diagnosis present

## 2019-01-29 DIAGNOSIS — R0600 Dyspnea, unspecified: Secondary | ICD-10-CM | POA: Diagnosis present

## 2019-01-29 DIAGNOSIS — Z79899 Other long term (current) drug therapy: Secondary | ICD-10-CM | POA: Diagnosis not present

## 2019-01-29 DIAGNOSIS — Z885 Allergy status to narcotic agent status: Secondary | ICD-10-CM | POA: Diagnosis not present

## 2019-01-29 DIAGNOSIS — J069 Acute upper respiratory infection, unspecified: Secondary | ICD-10-CM | POA: Diagnosis not present

## 2019-01-29 DIAGNOSIS — E871 Hypo-osmolality and hyponatremia: Secondary | ICD-10-CM | POA: Diagnosis present

## 2019-01-29 DIAGNOSIS — Z87891 Personal history of nicotine dependence: Secondary | ICD-10-CM | POA: Diagnosis not present

## 2019-01-29 DIAGNOSIS — R0902 Hypoxemia: Secondary | ICD-10-CM | POA: Diagnosis present

## 2019-01-29 DIAGNOSIS — F1010 Alcohol abuse, uncomplicated: Secondary | ICD-10-CM | POA: Diagnosis present

## 2019-01-29 DIAGNOSIS — W57XXXA Bitten or stung by nonvenomous insect and other nonvenomous arthropods, initial encounter: Secondary | ICD-10-CM | POA: Diagnosis present

## 2019-01-29 DIAGNOSIS — J1289 Other viral pneumonia: Secondary | ICD-10-CM | POA: Diagnosis present

## 2019-01-29 DIAGNOSIS — Z6829 Body mass index (BMI) 29.0-29.9, adult: Secondary | ICD-10-CM | POA: Diagnosis not present

## 2019-01-29 DIAGNOSIS — E872 Acidosis: Secondary | ICD-10-CM | POA: Diagnosis present

## 2019-01-29 DIAGNOSIS — U071 COVID-19: Secondary | ICD-10-CM | POA: Diagnosis present

## 2019-01-29 DIAGNOSIS — E876 Hypokalemia: Secondary | ICD-10-CM | POA: Diagnosis present

## 2019-01-29 DIAGNOSIS — J45909 Unspecified asthma, uncomplicated: Secondary | ICD-10-CM | POA: Diagnosis present

## 2019-01-29 LAB — COMPREHENSIVE METABOLIC PANEL
ALT: 37 U/L (ref 0–44)
AST: 43 U/L — ABNORMAL HIGH (ref 15–41)
Albumin: 3.4 g/dL — ABNORMAL LOW (ref 3.5–5.0)
Alkaline Phosphatase: 63 U/L (ref 38–126)
Anion gap: 9 (ref 5–15)
BUN: 19 mg/dL (ref 6–20)
CO2: 25 mmol/L (ref 22–32)
Calcium: 9.1 mg/dL (ref 8.9–10.3)
Chloride: 103 mmol/L (ref 98–111)
Creatinine, Ser: 1.12 mg/dL (ref 0.61–1.24)
GFR calc Af Amer: >60 mL/min (ref >60)
GFR calc non Af Amer: >60 mL/min (ref >60)
Glucose, Bld: 104 mg/dL — ABNORMAL HIGH (ref 70–99)
Potassium: 3.8 mmol/L (ref 3.5–5.1)
Sodium: 137 mmol/L (ref 135–145)
Total Bilirubin: 0.7 mg/dL (ref 0.3–1.2)
Total Protein: 7.7 g/dL (ref 6.5–8.1)

## 2019-01-29 MED ORDER — PROMETHAZINE HCL 25 MG PO TABS
25.0000 mg | ORAL_TABLET | Freq: Four times a day (QID) | ORAL | Status: DC | PRN
  Administered 2019-01-29: 25 mg via ORAL
  Filled 2019-01-29: qty 1

## 2019-01-29 MED ORDER — LORAZEPAM 0.5 MG PO TABS
1.0000 mg | ORAL_TABLET | Freq: Four times a day (QID) | ORAL | Status: DC | PRN
  Administered 2019-01-29 (×2): 1 mg via ORAL
  Filled 2019-01-29 (×2): qty 2

## 2019-01-29 MED ORDER — THIAMINE HCL 100 MG/ML IJ SOLN: 100.0000 mg | Freq: Every day | INTRAMUSCULAR | Status: DC

## 2019-01-29 MED ORDER — PROMETHAZINE HCL 25 MG RE SUPP
25.0000 mg | Freq: Four times a day (QID) | RECTAL | Status: DC | PRN
  Filled 2019-01-29: qty 1

## 2019-01-29 MED ORDER — FOLIC ACID 1 MG PO TABS
1.0000 mg | ORAL_TABLET | Freq: Every day | ORAL | Status: DC
  Administered 2019-01-29: 1 mg via ORAL
  Filled 2019-01-29: qty 1

## 2019-01-29 MED ORDER — SODIUM CHLORIDE 0.9 % IV SOLN
INTRAVENOUS | Status: AC
  Administered 2019-01-29: 11:00:00 via INTRAVENOUS

## 2019-01-29 MED ORDER — ADULT MULTIVITAMIN W/MINERALS CH
1.0000 | ORAL_TABLET | Freq: Every day | ORAL | Status: DC
  Administered 2019-01-29: 1 via ORAL
  Filled 2019-01-29: qty 1

## 2019-01-29 MED ORDER — LORAZEPAM 2 MG/ML IJ SOLN: 1.0000 mg | Freq: Four times a day (QID) | INTRAMUSCULAR | Status: DC | PRN

## 2019-01-29 MED ORDER — LOPERAMIDE HCL 2 MG PO CAPS
2.0000 mg | ORAL_CAPSULE | ORAL | Status: DC | PRN
  Administered 2019-01-29: 2 mg via ORAL
  Filled 2019-01-29: qty 1

## 2019-01-29 MED ORDER — VITAMIN B-1 100 MG PO TABS
100.0000 mg | ORAL_TABLET | Freq: Every day | ORAL | Status: DC
  Administered 2019-01-29: 100 mg via ORAL
  Filled 2019-01-29: qty 1

## 2019-01-29 MED ORDER — PROMETHAZINE HCL 25 MG/ML IJ SOLN: 12.5000 mg | Freq: Four times a day (QID) | INTRAMUSCULAR | Status: DC | PRN

## 2019-01-29 NOTE — Progress Notes (Signed)
PROGRESS NOTE    Victor Blanchard  VQM:[id number] DOB: Dec 02, 1975 DOA: 01/28/2019 PCP: Dione Housekeeper, MD      Brief Narrative:  Victor Blanchard is a 43 y.o. M with obesity otherwise healthy who presented with several days fever.  Was diagnosed with COVID 6/10 due to fever, aches.  Since then, self-isolating at home.  Now in last few days, had vomiting, diarrhea, unable to keep anything down.  CXR showed pneumonia.  Required supplemental O2 "for comfort".     Assessment & Plan:  Coronavirus pneumonitis without acute hypoxic respiratory failure Nausea, vomiting and diarrhea In setting of ongoing 2020 COVID-19 pandemic. Hypoxic with ambulation.  -VTE PPx with Lovenox -Continue Zinc and Vitamin C -Daily d-dimer, ferritin and CRP  COVID-19 Labs Recent Labs    01/28/19 1024 01/28/19 1047  DDIMER  --  0.37  FERRITIN 639*  --   LDH  --  270*  CRP >20.0*  --    N/V/D -Continue PRN Zofran -PRN phenergan for breakthrough -Imodium PRN -Restart IV fluids  Alcohol withdrawal CIWA 0-4 overnight.  Denies symptoms, no previous history withdrwawal. -Continue folate -Continue CIWA scoring with on demand lorazepam for elevated CIWA score -If no lorazepam by tomororw, can likely stop CIWA scoring  Other medicaitons -Continue montelukast and PPI  Hypokalemia K normalized today  Hyponatremia Hypovolemic initially, now resolved with fulids.  Mild non-gap acidosis From diarrhea.  Tick bite Saw tick on body, no hstory bite, no rash.  Better alternative explanation for his fever is COVID, not RMSF.  Defer doxycycline.       MDM and disposition: The below labs and imaging reports were reviewed and summarized above.  Medication management as above.  The patient was admitted with COVID and diarrhea.  He requires oxygen with ambulation still and is very dyspneic with slight ambulation.  In addition, he has intractable vomiting, unable to tolerate anything by mouth.  At this  time, continued inpatient services are reasonable and expected, given the high likelihood of an adverse outcome, including readmission, debility or death if the patient were to be discharged prematurely.  .       DVT prophylaxis: Lovenox Code Status: FULL Family Communication:     Consultants:   None  Procedures:   None  Antimicrobials:   None      Subjective: Vomiting all morning.  Diarrhea is uncontrolled.  "COming out both ends".  No further fever.  Still tachycardia and tachypneic.  Burning pleuritic chest pain.  No rash, no headache.    Objective: Vitals:   01/29/19 0441 01/29/19 0500 01/29/19 0600 01/29/19 0749  BP: 98/69 134/74 119/82 98/65  Pulse: (!) 103 86 78 (!) 59  Resp: (!) 25 (!) 29 (!) 25 15  Temp: 99.7 F (37.6 C)   98 F (36.7 C)  TempSrc: Oral     SpO2: 95% 94% 96% 98%  Weight:      Height:        Intake/Output Summary (Last 24 hours) at 01/29/2019 1152 Last data filed at 01/29/2019 1000 Gross per 24 hour  Intake 1719 ml  Output --  Net 1719 ml   Filed Weights   01/28/19 1029  Weight: 104.3 kg    Examination: General appearance:  adult male, alert and in mild distress.  Appears tired HEENT: Anicteric, conjunctiva pink, lids and lashes normal. No nasal deformity, discharge, epistaxis.  Lips moist, dentition .   Skin: Warm and dry.  no jaundice.  No suspicious rashes or  lesions. Cardiac: Tachycardic, regular nl S1-S2, no murmurs appreciated.  Capillary refill is brisk.  JVP normal.  No LE edema.  Radial pulses 2+ and symmetric. Respiratory: Normal respiratory rate and rhythm.  CTAB without rales or wheezes. Abdomen: Abdomen soft.  Mild nonfocal TTP. No ascites, distension, hepatosplenomegaly.   MSK: No deformities or effusions. Neuro: Awake and alert.  EOMI, moves all extremities. Speech fluent.    Psych: Sensorium intact and responding to questions, attention normal. Affect normal.  Judgment and insight appear normal.     Data  Reviewed: I have personally reviewed following labs and imaging studies:  CBC: Recent Labs  Lab 01/28/19 1047  WBC 10.0  NEUTROABS 8.1*  HGB 15.3  HCT 43.4  MCV 84.4  PLT 205   Basic Metabolic Panel: Recent Labs  Lab 01/28/19 1047 01/29/19 0700  NA 131* 137  K 3.1* 3.8  CL 99 103  CO2 19* 25  GLUCOSE 117* 104*  BUN 23* 19  CREATININE 1.24 1.12  CALCIUM 8.8* 9.1   GFR: Estimated Creatinine Clearance: 111.7 mL/min (by C-G formula based on SCr of 1.12 mg/dL). Liver Function Tests: Recent Labs  Lab 01/28/19 1047 01/29/19 0700  AST 32 43*  ALT 33 37  ALKPHOS 64 63  BILITOT 1.3* 0.7  PROT 8.0 7.7  ALBUMIN 3.9 3.4*   No results for input(s): LIPASE, AMYLASE in the last 168 hours. No results for input(s): AMMONIA in the last 168 hours. Coagulation Profile: No results for input(s): INR, PROTIME in the last 168 hours. Cardiac Enzymes: No results for input(s): CKTOTAL, CKMB, CKMBINDEX, TROPONINI in the last 168 hours. BNP (last 3 results) No results for input(s): PROBNP in the last 8760 hours. HbA1C: No results for input(s): HGBA1C in the last 72 hours. CBG: No results for input(s): GLUCAP in the last 168 hours. Lipid Profile: No results for input(s): CHOL, HDL, LDLCALC, TRIG, CHOLHDL, LDLDIRECT in the last 72 hours. Thyroid Function Tests: No results for input(s): TSH, T4TOTAL, FREET4, T3FREE, THYROIDAB in the last 72 hours. Anemia Panel: Recent Labs    01/28/19 1024  FERRITIN 639*   Urine analysis:    Component Value Date/Time   COLORURINE AMBER (A) 01/28/2019 1401   APPEARANCEUR HAZY (A) 01/28/2019 1401   LABSPEC 1.031 (H) 01/28/2019 1401   PHURINE 5.0 01/28/2019 1401   GLUCOSEU NEGATIVE 01/28/2019 1401   HGBUR SMALL (A) 01/28/2019 1401   BILIRUBINUR NEGATIVE 01/28/2019 1401   KETONESUR 5 (A) 01/28/2019 1401   PROTEINUR 30 (A) 01/28/2019 1401   NITRITE NEGATIVE 01/28/2019 1401   LEUKOCYTESUR NEGATIVE 01/28/2019 1401   Sepsis  Labs: @LABRCNTIP (procalcitonin:4,lacticacidven:4)  )No results found for this or any previous visit (from the past 240 hour(s)).       Radiology Studies: Dg Chest Portable 1 View  Result Date: 01/28/2019 CLINICAL DATA:  COVID-19 positive.  Increased shortness of breath. EXAM: PORTABLE CHEST 1 VIEW COMPARISON:  01/18/2019 FINDINGS: Grossly unchanged cardiac silhouette and mediastinal contours with persistent thickening of the right paratracheal stripe. Interval development heterogeneous airspace opacity with the peripheral aspect the right upper lung. Ill-defined nodular airspace opacities are seen with the peripheral aspect of the left mid and lower lung. No pleural effusion or pneumothorax. No evidence of edema. No acute osseus abnormalities. IMPRESSION: Findings worrisome for development of multifocal infection most severely affecting the peripheral aspect of the right upper lung, compatible with provided history of patient's COVID-19 positivity. Electronically Signed   By: Simonne ComeJohn  Watts M.D.   On: 01/28/2019 13:14  Scheduled Meds:  enoxaparin (LOVENOX) injection  40 mg Subcutaneous Q24H   folic acid  1 mg Oral Daily   loratadine  10 mg Oral Daily   montelukast  10 mg Oral Daily   multivitamin with minerals  1 tablet Oral Daily   pantoprazole  40 mg Oral Daily   sodium chloride flush  3 mL Intravenous Q12H   sodium chloride flush  3 mL Intravenous Q12H   thiamine  100 mg Oral Daily   Or   thiamine  100 mg Intravenous Daily   vitamin C  500 mg Oral Daily   zinc sulfate  220 mg Oral Daily   Continuous Infusions:  sodium chloride     sodium chloride 100 mL/hr at 01/29/19 1109     LOS: 0 days    Time spent: 25 minutes      Alberteen Samhristopher P Keeven Matty, MD [facility] 01/29/2019, 11:52 AM     Please page through AMION:  [email or web address] Password [email or web address] If 7PM-7AM, please contact night-coverage

## 2019-01-29 NOTE — Plan of Care (Signed)
Progressing

## 2019-01-29 NOTE — ED Notes (Signed)
Called to [facility] to inform staff that Victor Blanchard is here picking up pt and they are on way.

## 2019-01-29 NOTE — Plan of Care (Signed)
Care plans initiated.

## 2019-01-30 DIAGNOSIS — J1289 Other viral pneumonia: Secondary | ICD-10-CM

## 2019-01-30 DIAGNOSIS — E876 Hypokalemia: Secondary | ICD-10-CM

## 2019-01-30 LAB — COMPREHENSIVE METABOLIC PANEL
ALT: 47 U/L — ABNORMAL HIGH (ref 0–44)
AST: 62 U/L — ABNORMAL HIGH (ref 15–41)
Albumin: 3 g/dL — ABNORMAL LOW (ref 3.5–5.0)
Alkaline Phosphatase: 59 U/L (ref 38–126)
Anion gap: 14 (ref 5–15)
BUN: 20 mg/dL (ref 6–20)
CO2: 18 mmol/L — ABNORMAL LOW (ref 22–32)
Calcium: 8.6 mg/dL — ABNORMAL LOW (ref 8.9–10.3)
Chloride: 104 mmol/L (ref 98–111)
Creatinine, Ser: 1.07 mg/dL (ref 0.61–1.24)
GFR calc Af Amer: >60 mL/min (ref >60)
GFR calc non Af Amer: >60 mL/min (ref >60)
Glucose, Bld: 95 mg/dL (ref 70–99)
Potassium: 3.4 mmol/L — ABNORMAL LOW (ref 3.5–5.1)
Sodium: 136 mmol/L (ref 135–145)
Total Bilirubin: 0.6 mg/dL (ref 0.3–1.2)
Total Protein: 7.1 g/dL (ref 6.5–8.1)

## 2019-01-30 LAB — CBC
HCT: 38.8 % — ABNORMAL LOW (ref 39.0–52.0)
Hemoglobin: 13.1 g/dL (ref 13.0–17.0)
MCH: 29.6 pg (ref 26.0–34.0)
MCHC: 33.8 g/dL (ref 30.0–36.0)
MCV: 87.8 fL (ref 80.0–100.0)
Platelets: 226 10*3/uL (ref 150–400)
RBC: 4.42 MIL/uL (ref 4.22–5.81)
RDW: 12.8 % (ref 11.5–15.5)
WBC: 8.7 10*3/uL (ref 4.0–10.5)
nRBC: 0 % (ref 0.0–0.2)

## 2019-01-30 LAB — D-DIMER, QUANTITATIVE: D-Dimer, Quant: 0.53 ug/mL-FEU — ABNORMAL HIGH (ref 0.00–0.50)

## 2019-01-30 LAB — FERRITIN: Ferritin: 1118 ng/mL — ABNORMAL HIGH (ref 24–336)

## 2019-01-30 LAB — C-REACTIVE PROTEIN: CRP: 27.9 mg/dL — ABNORMAL HIGH (ref <1.0)

## 2019-01-30 NOTE — Progress Notes (Signed)
PROGRESS NOTE    Victor Blanchard  [id number] DOB: 1976-04-07 DOA: 01/28/2019 PCP: Joette CatchingNyland, Leonard, MD      Brief Narrative:  Victor Blanchard is a 43 y.o. M with obesity otherwise healthy who presented with several days fever.  Was diagnosed with COVID 6/10 due to fever, aches.  Since then, self-isolating at home.  Now in last few days, had vomiting, diarrhea, unable to keep anything down.  CXR showed pneumonia.  Required supplemental O2 "for comfort".     Assessment & Plan:  Coronavirus pneumonitis without acute hypoxic respiratory failure Nausea, vomiting and diarrhea In setting of ongoing 2020 COVID-19 pandemic.  Inflammatory markers trending, still tachycardic and tachypneic. Very dyspneic with ambulation. -Continue VTE PPx with Lovenox -Continue Zinc and Vitamin C -Daily d-dimer, ferritin and CRP  COVID-19 Labs Recent Labs    01/28/19 1024 01/28/19 1047 01/30/19 0158  DDIMER  --  0.37 0.53*  FERRITIN 639*  --  1,118*  LDH  --  270*  --   CRP >20.0*  --  27.9*   N/V/D Improved.  Able to take PO. -Continue PRN Zofran -PRN phenergan for breakthrough -Imodium PRN  Alcohol withdrawal Denies symptoms of withdrawal. No previous history of withdrawal.  -Stop CIWA scoring  Other medicaitons -Continue montelukast and PPI  Hypokalemia Mild -Replete K  Hyponatremia Mild  Transaminitis Mild, from COVID -Trend LFTs  Mild non-gap acidosis From diarrhea.  Tick bite Saw tick on body, no hstory bite, no rash.  Better alternative explanation for his fever is COVID, not RMSF.  Defer doxycycline.       MDM and disposition: The below labs and imaging reports are reviewed and summarized above.  Medication management as above.  The patient was admitted with COVID-19 and diarrhea.    He is now able to take food by mouth, in small amounts, not back to normal.  In addition he is still extremely dyspneic with exertion, breathing 30 times per minute rest.  continued inpatient services are reasonable and expected, given the high likelihood of an adverse outcome, including readmission, debility or death if the patient were to be discharged prematurely.  .       DVT prophylaxis: Lovenox Code Status: FULL Family Communication:     Consultants:   None  Procedures:   None  Antimicrobials:   None      Subjective: Vomiting improved, diarrhea has resolved.  No further fever, but still tachycardic and tachypneic.  No sputum production, no confusion.    Objective: Vitals:   01/29/19 2315 01/30/19 0015 01/30/19 0430 01/30/19 0827  BP:   114/84 125/87  Pulse: 92 (!) 103 68   Resp:   (!) 22   Temp:   98.8 F (37.1 C) 99.1 F (37.3 C)  TempSrc:   Oral Oral  SpO2:   99%   Weight:      Height:        Intake/Output Summary (Last 24 hours) at 01/30/2019 1043 Last data filed at 01/30/2019 0900 Gross per 24 hour  Intake 1341.15 ml  Output 350 ml  Net 991.15 ml   Filed Weights   01/28/19 1029  Weight: 104.3 kg    Examination: General appearance: Adult male, lying in bed, no acute distress, interactive. HEENT: Anicteric, conjunctival pink, lids and lashes normal, no nasal deformity, discharge, or epistaxis.  Lips moist, dentition normal, oropharynx moist, no oral lesions.  Hearing normal. Skin: Warm and dry.  no jaundice.  No suspicious rashes or lesions. Cardiac:  Rate normal, regular, no murmurs appreciated, JVP not visible, no lower extremity edema. Respiratory: Tachypneic at rest, no rales or wheezes. Abdomen: Abdomen soft, and no tenderness to palpation.  No ascites or distention. MSK: No deformities or effusions. Neuro: Awake and alert, extraocular movements intact, moves all extremities with normal strength and coordination, speech fluent. Psych: Sensorium intact and responding to questions, attention normal, affect normal, judgment and insight appear normal.     Data Reviewed: I have personally reviewed following  labs and imaging studies:  CBC: Recent Labs  Lab 01/28/19 1047 01/30/19 0158  WBC 10.0 8.7  NEUTROABS 8.1*  --   HGB 15.3 13.1  HCT 43.4 38.8*  MCV 84.4 87.8  PLT 205 025   Basic Metabolic Panel: Recent Labs  Lab 01/28/19 1047 01/29/19 0700 01/30/19 0158  NA 131* 137 136  K 3.1* 3.8 3.4*  CL 99 103 104  CO2 19* 25 18*  GLUCOSE 117* 104* 95  BUN 23* 19 20  CREATININE 1.24 1.12 1.07  CALCIUM 8.8* 9.1 8.6*   GFR: Estimated Creatinine Clearance: 116.9 mL/min (by C-G formula based on SCr of 1.07 mg/dL). Liver Function Tests: Recent Labs  Lab 01/28/19 1047 01/29/19 0700 01/30/19 0158  AST 32 43* 62*  ALT 33 37 47*  ALKPHOS 64 63 59  BILITOT 1.3* 0.7 0.6  PROT 8.0 7.7 7.1  ALBUMIN 3.9 3.4* 3.0*   No results for input(s): LIPASE, AMYLASE in the last 168 hours. No results for input(s): AMMONIA in the last 168 hours. Coagulation Profile: No results for input(s): INR, PROTIME in the last 168 hours. Cardiac Enzymes: No results for input(s): CKTOTAL, CKMB, CKMBINDEX, TROPONINI in the last 168 hours. BNP (last 3 results) No results for input(s): PROBNP in the last 8760 hours. HbA1C: No results for input(s): HGBA1C in the last 72 hours. CBG: No results for input(s): GLUCAP in the last 168 hours. Lipid Profile: No results for input(s): CHOL, HDL, LDLCALC, TRIG, CHOLHDL, LDLDIRECT in the last 72 hours. Thyroid Function Tests: No results for input(s): TSH, T4TOTAL, FREET4, T3FREE, THYROIDAB in the last 72 hours. Anemia Panel: Recent Labs    01/28/19 1024 01/30/19 0158  FERRITIN 639* 1,118*   Urine analysis:    Component Value Date/Time   COLORURINE AMBER (A) 01/28/2019 1401   APPEARANCEUR HAZY (A) 01/28/2019 1401   LABSPEC 1.031 (H) 01/28/2019 1401   PHURINE 5.0 01/28/2019 1401   GLUCOSEU NEGATIVE 01/28/2019 1401   HGBUR SMALL (A) 01/28/2019 1401   BILIRUBINUR NEGATIVE 01/28/2019 1401   KETONESUR 5 (A) 01/28/2019 1401   PROTEINUR 30 (A) 01/28/2019 1401    NITRITE NEGATIVE 01/28/2019 1401   LEUKOCYTESUR NEGATIVE 01/28/2019 1401   Sepsis Labs: @LABRCNTIP (procalcitonin:4,lacticacidven:4)  )No results found for this or any previous visit (from the past 240 hour(s)).       Radiology Studies: Dg Chest Portable 1 View  Result Date: 01/28/2019 CLINICAL DATA:  COVID-19 positive.  Increased shortness of breath. EXAM: PORTABLE CHEST 1 VIEW COMPARISON:  01/18/2019 FINDINGS: Grossly unchanged cardiac silhouette and mediastinal contours with persistent thickening of the right paratracheal stripe. Interval development heterogeneous airspace opacity with the peripheral aspect the right upper lung. Ill-defined nodular airspace opacities are seen with the peripheral aspect of the left mid and lower lung. No pleural effusion or pneumothorax. No evidence of edema. No acute osseus abnormalities. IMPRESSION: Findings worrisome for development of multifocal infection most severely affecting the peripheral aspect of the right upper lung, compatible with provided history of patient's COVID-19 positivity. Electronically  Signed   By: Simonne ComeJohn  Watts M.D.   On: 01/28/2019 13:14        Scheduled Meds: . enoxaparin (LOVENOX) injection  40 mg Subcutaneous Q24H  . loratadine  10 mg Oral Daily  . montelukast  10 mg Oral Daily  . pantoprazole  40 mg Oral Daily  . vitamin C  500 mg Oral Daily  . zinc sulfate  220 mg Oral Daily   Continuous Infusions:    LOS: 1 day    Time spent: 25 minutes      Alberteen Samhristopher P Aideliz Garmany, MD [facility] 01/30/2019, 10:43 AM     Please page through AMION:  [email or web address] Password [email or web address] If 7PM-7AM, please contact night-coverage

## 2019-01-31 LAB — COMPREHENSIVE METABOLIC PANEL
ALT: 141 U/L — ABNORMAL HIGH (ref 0–44)
AST: 171 U/L — ABNORMAL HIGH (ref 15–41)
Albumin: 2.9 g/dL — ABNORMAL LOW (ref 3.5–5.0)
Alkaline Phosphatase: 88 U/L (ref 38–126)
Anion gap: 13 (ref 5–15)
BUN: 15 mg/dL (ref 6–20)
CO2: 23 mmol/L (ref 22–32)
Calcium: 8.7 mg/dL — ABNORMAL LOW (ref 8.9–10.3)
Chloride: 100 mmol/L (ref 98–111)
Creatinine, Ser: 0.9 mg/dL (ref 0.61–1.24)
GFR calc Af Amer: >60 mL/min (ref >60)
GFR calc non Af Amer: >60 mL/min (ref >60)
Glucose, Bld: 84 mg/dL (ref 70–99)
Potassium: 3.4 mmol/L — ABNORMAL LOW (ref 3.5–5.1)
Sodium: 136 mmol/L (ref 135–145)
Total Bilirubin: 0.6 mg/dL (ref 0.3–1.2)
Total Protein: 7.2 g/dL (ref 6.5–8.1)

## 2019-01-31 LAB — CBC
HCT: 40.1 % (ref 39.0–52.0)
Hemoglobin: 13.4 g/dL (ref 13.0–17.0)
MCH: 29.3 pg (ref 26.0–34.0)
MCHC: 33.4 g/dL (ref 30.0–36.0)
MCV: 87.7 fL (ref 80.0–100.0)
Platelets: 250 10*3/uL (ref 150–400)
RBC: 4.57 MIL/uL (ref 4.22–5.81)
RDW: 12.5 % (ref 11.5–15.5)
WBC: 5.4 10*3/uL (ref 4.0–10.5)
nRBC: 0 % (ref 0.0–0.2)

## 2019-01-31 LAB — C-REACTIVE PROTEIN: CRP: 20.5 mg/dL — ABNORMAL HIGH (ref <1.0)

## 2019-01-31 LAB — FERRITIN: Ferritin: 1462 ng/mL — ABNORMAL HIGH (ref 24–336)

## 2019-01-31 LAB — D-DIMER, QUANTITATIVE: D-Dimer, Quant: 0.63 ug/mL-FEU — ABNORMAL HIGH (ref 0.00–0.50)

## 2019-01-31 MED ORDER — ALPRAZOLAM 0.5 MG PO TABS
1.0000 mg | ORAL_TABLET | Freq: Once | ORAL | Status: AC
  Administered 2019-01-31: 1 mg via ORAL
  Filled 2019-01-31: qty 2

## 2019-01-31 MED ORDER — POTASSIUM CHLORIDE CRYS ER 20 MEQ PO TBCR
40.0000 meq | EXTENDED_RELEASE_TABLET | Freq: Once | ORAL | Status: AC
  Administered 2019-01-31: 40 meq via ORAL
  Filled 2019-01-31: qty 2

## 2019-01-31 NOTE — Progress Notes (Signed)
PROGRESS NOTE    Victor Blanchard  ZOX:[id number] DOB: [birth year] DOA: 01/28/2019 PCP: Dione Housekeeper, MD      Brief Narrative:  Victor Blanchard is a 43 y.o. M with obesity otherwise healthy who presented with several days fever.  Was diagnosed with COVID 6/10 due to fever, aches.  Since then, self-isolating at home.  Now in last few days, had vomiting, diarrhea, unable to keep anything down.  CXR showed pneumonia.  Required supplemental O2 "for comfort".     Assessment & Plan:  COVID-19 Nausea, vomiting and diarrhea In setting of ongoing 2020 COVID-19 pandemic.  CRP stable, dimer trending up.   Febrile yesteray afternoon, respiratory rate 25-27 overnight.   Still dyspneic with ambulation -Continue VTE PPx with Lovenox -Continue Zinc and Vitamin C -Daily d-dimer, ferritin and CRP  COVID-19 Labs Recent Labs    01/30/19 0158 01/31/19 0420  DDIMER 0.53* 0.63*  FERRITIN 1,118* 1,462*  CRP 27.9* 20.5*   N/V/D Resolved -Continue PRN Zofran -PRN phenergan for breakthrough -Imodium PRN  Alcohol withdrawal Denies symptoms of withdrawal. No previous history of withdrawal.   CIWA scoring for 48 hours, essentially normal, no symptoms of withdrawal.  Other medicaitons -Continue montelukast and PPI  Hypokalemia Mild -Supplement K  Hyponatremia Mild  Transaminitis Trending up -Close monitoring LFTs  Mild non-gap acidosis From diarrhea.  Tick bite Saw tick on body, no hstory bite, no rash.  Better alternative explanation for his fever is COVID, not RMSF.  Defer doxycycline.       MDM and disposition: The below labs and imaging reports reviewed and summarized above.  Medication management as above.  The patient was admitted with COVID-19 and diarrhea.    He is still quite tachypneic at rest and worse with exertion, very dyspneic.  Fever over the last 24 hours.  We will continue supportive care, anticipate discharge within the next 24 to 48 hours.    .        DVT prophylaxis: Lovenox Code Status: FULL Family Communication: Father by phone    Consultants:   None  Procedures:   None  Antimicrobials:   None      Subjective: Pain and diarrhea resolved.  Fever yesterday none this morning.  Still very tachypneic.    Objective: Vitals:   01/31/19 0359 01/31/19 0700 01/31/19 1320 01/31/19 1632  BP: 116/83 114/85 110/88 117/76  Pulse: 72  92   Resp: (!) 27  (!) 25   Temp: 99.6 F (37.6 C) 100 F (37.8 C) 98.6 F (37 C) 99 F (37.2 C)  TempSrc: Oral Oral Oral Oral  SpO2: 96%  95%   Weight:      Height:        Intake/Output Summary (Last 24 hours) at 01/31/2019 1639 Last data filed at 01/31/2019 0600 Gross per 24 hour  Intake 480 ml  Output -  Net 480 ml   Filed Weights   01/28/19 1029  Weight: 104.3 kg    Examination: General appearance: Adult male, lying in bed, no acute distress HEENT: Anicteric, conjunctival pink, lids and lashes normal, no nasal deformity, discharge, or epistaxis.  Lips moist, dentition normal, oropharynx moist, no oral lesions.  Hearing normal. Skin: Warm and dry.  no jaundice.  No suspicious rashes or lesions. Cardiac: Regular rate and rhythm, JVP normal, no lower extremity edema. Respiratory: Tachypneic, dyspneic with exertion. Abdomen: Abdomen soft, somewhat distended, but no ascites, no TTP. MSK: No deformities or effusions. Neuro: Awake and alert, extraocular movements intact,  moves all extremities with normal strength and coordination, speech fluent. Psych: Sensorium intact and responding to questions, attention normal, affect normal, judgment and insight appear normal.     Data Reviewed: I have personally reviewed following labs and imaging studies:  CBC: Recent Labs  Lab 01/28/19 1047 01/30/19 0158 01/31/19 0420  WBC 10.0 8.7 5.4  NEUTROABS 8.1*  --   --   HGB 15.3 13.1 13.4  HCT 43.4 38.8* 40.1  MCV 84.4 87.8 87.7  PLT 205 226 250   Basic Metabolic Panel: Recent  Labs  Lab 01/28/19 1047 01/29/19 0700 01/30/19 0158 01/31/19 0420  NA 131* 137 136 136  K 3.1* 3.8 3.4* 3.4*  CL 99 103 104 100  CO2 19* 25 18* 23  GLUCOSE 117* 104* 95 84  BUN 23* 19 20 15   CREATININE 1.24 1.12 1.07 0.90  CALCIUM 8.8* 9.1 8.6* 8.7*   GFR: Estimated Creatinine Clearance: 139 mL/min (by C-G formula based on SCr of 0.9 mg/dL). Liver Function Tests: Recent Labs  Lab 01/28/19 1047 01/29/19 0700 01/30/19 0158 01/31/19 0420  AST 32 43* 62* 171*  ALT 33 37 47* 141*  ALKPHOS 64 63 59 88  BILITOT 1.3* 0.7 0.6 0.6  PROT 8.0 7.7 7.1 7.2  ALBUMIN 3.9 3.4* 3.0* 2.9*   No results for input(s): LIPASE, AMYLASE in the last 168 hours. No results for input(s): AMMONIA in the last 168 hours. Coagulation Profile: No results for input(s): INR, PROTIME in the last 168 hours. Cardiac Enzymes: No results for input(s): CKTOTAL, CKMB, CKMBINDEX, TROPONINI in the last 168 hours. BNP (last 3 results) No results for input(s): PROBNP in the last 8760 hours. HbA1C: No results for input(s): HGBA1C in the last 72 hours. CBG: No results for input(s): GLUCAP in the last 168 hours. Lipid Profile: No results for input(s): CHOL, HDL, LDLCALC, TRIG, CHOLHDL, LDLDIRECT in the last 72 hours. Thyroid Function Tests: No results for input(s): TSH, T4TOTAL, FREET4, T3FREE, THYROIDAB in the last 72 hours. Anemia Panel: Recent Labs    01/30/19 0158 01/31/19 0420  FERRITIN 1,118* 1,462*   Urine analysis:    Component Value Date/Time   COLORURINE AMBER (A) 01/28/2019 1401   APPEARANCEUR HAZY (A) 01/28/2019 1401   LABSPEC 1.031 (H) 01/28/2019 1401   PHURINE 5.0 01/28/2019 1401   GLUCOSEU NEGATIVE 01/28/2019 1401   HGBUR SMALL (A) 01/28/2019 1401   BILIRUBINUR NEGATIVE 01/28/2019 1401   KETONESUR 5 (A) 01/28/2019 1401   PROTEINUR 30 (A) 01/28/2019 1401   NITRITE NEGATIVE 01/28/2019 1401   LEUKOCYTESUR NEGATIVE 01/28/2019 1401   Sepsis Labs:  @[email or web address] (procalcitonin:4,lacticacidven:4)  )No results found for this or any previous visit (from the past 240 hour(s)).       Radiology Studies: No results found.      Scheduled Meds: . enoxaparin (LOVENOX) injection  40 mg Subcutaneous Q24H  . loratadine  10 mg Oral Daily  . montelukast  10 mg Oral Daily  . pantoprazole  40 mg Oral Daily  . vitamin C  500 mg Oral Daily  . zinc sulfate  220 mg Oral Daily   Continuous Infusions:    LOS: 2 days    Time spent: 25 minutes      Alberteen Samhristopher P Maryellen Dowdle, MD [facility] 01/31/2019, 4:39 PM     Please page through AMION:  [email or web address] Password [email or web address] If 7PM-7AM, please contact night-coverage

## 2019-02-01 LAB — C-REACTIVE PROTEIN: CRP: 17.3 mg/dL — ABNORMAL HIGH (ref <1.0)

## 2019-02-01 LAB — COMPREHENSIVE METABOLIC PANEL
ALT: 152 U/L — ABNORMAL HIGH (ref 0–44)
AST: 108 U/L — ABNORMAL HIGH (ref 15–41)
Albumin: 2.8 g/dL — ABNORMAL LOW (ref 3.5–5.0)
Alkaline Phosphatase: 96 U/L (ref 38–126)
Anion gap: 12 (ref 5–15)
BUN: 12 mg/dL (ref 6–20)
CO2: 22 mmol/L (ref 22–32)
Calcium: 8.7 mg/dL — ABNORMAL LOW (ref 8.9–10.3)
Chloride: 102 mmol/L (ref 98–111)
Creatinine, Ser: 0.81 mg/dL (ref 0.61–1.24)
GFR calc Af Amer: >60 mL/min (ref >60)
GFR calc non Af Amer: >60 mL/min (ref >60)
Glucose, Bld: 93 mg/dL (ref 70–99)
Potassium: 3.3 mmol/L — ABNORMAL LOW (ref 3.5–5.1)
Sodium: 136 mmol/L (ref 135–145)
Total Bilirubin: 1 mg/dL (ref 0.3–1.2)
Total Protein: 7.2 g/dL (ref 6.5–8.1)

## 2019-02-01 LAB — CBC
HCT: 39 % (ref 39.0–52.0)
Hemoglobin: 13.2 g/dL (ref 13.0–17.0)
MCH: 29.5 pg (ref 26.0–34.0)
MCHC: 33.8 g/dL (ref 30.0–36.0)
MCV: 87.2 fL (ref 80.0–100.0)
Platelets: 265 10*3/uL (ref 150–400)
RBC: 4.47 MIL/uL (ref 4.22–5.81)
RDW: 12.5 % (ref 11.5–15.5)
WBC: 5.2 10*3/uL (ref 4.0–10.5)
nRBC: 0 % (ref 0.0–0.2)

## 2019-02-01 LAB — D-DIMER, QUANTITATIVE: D-Dimer, Quant: 0.62 ug/mL-FEU — ABNORMAL HIGH (ref 0.00–0.50)

## 2019-02-01 LAB — FERRITIN: Ferritin: 1253 ng/mL — ABNORMAL HIGH (ref 24–336)

## 2019-02-01 NOTE — Progress Notes (Signed)
PROGRESS NOTE  Victor DellJason W Faison  ZOX:[id number] DOB: 1975/10/24 DOA: 01/28/2019 PCP: Joette CatchingNyland, Leonard, MD   Brief Narrative: Victor Blanchard is a 43 y.o. male with a history of alcohol abuse who presented with several days of fever worsening since diagnosis with covid on 6/10. He had since developed body aches, chills, vomiting and diarrhea, unable to maintain sufficient per oral intake. On arrival he was febrile with tachypnea, elevated inflammatory markers, and peripheral CXR opacities.    Assessment & Plan: Active Problems:   Acute respiratory disease due to COVID-19 virus  Covid-19 pneumonia: Based on time of symptoms, suspect he is >2 weeks into infection and thus at low risk of worsening at this time, but inflammatory markers remain grossly elevated. CRP is 17. - Check ambulatory pulse oximetry and can likely discharge once proven to be not hypoxic and sustains decline in inflammatory markers.  - Continue airborne, contact precautions. PPE including surgical gown, gloves, face shield, cap, shoe covers, and N-95 used during this encounter in a negative pressure room.  - Check daily labs: CBC w/diff, CMP, d-dimer, fibrinogen, ferritin, LDH, CRP - Avoid NSAIDs - Recommend proning and aggressive use of incentive spirometry.  Asthma: Continue singulair, prn albuterol MDI  Alcohol abuse: ~6 cans of beer/day, last drink 6/16 due to vomiting. No evidence of withdrawal.  - Moderation recommended. No indication for CIWA at this time  Hypokalemia: Monitor and replace prn  Hyponatremia: Mild, will monitor.   NAGMA: Due to GI losses  DVT prophylaxis: Lovenox Code Status: Full Family Communication: None at bedside Disposition Plan: Home once improved, possibly 6/25.   Consultants:   None  Procedures:   None  Antimicrobials:  None   Subjective: Still subjectively short of breath, worse with ambulation but willing to get up more. Low grade chills last night again. Diarrhea improving,  tolerating some po.   Objective: Vitals:   01/31/19 1632 01/31/19 1932 02/01/19 0410 02/01/19 0846  BP: 117/76 110/76 125/87 118/83  Pulse:  81 74 78  Resp:  (!) 30 (!) 27 (!) 21  Temp: 99 F (37.2 C) 98.4 F (36.9 C) 99.8 F (37.7 C) 99.1 F (37.3 C)  TempSrc: Oral Oral Oral Oral  SpO2:  96% 96% 96%  Weight:      Height:        Intake/Output Summary (Last 24 hours) at 02/01/2019 1305 Last data filed at 01/31/2019 2027 Gross per 24 hour  Intake 600 ml  Output 500 ml  Net 100 ml   Filed Weights   01/28/19 1029  Weight: 104.3 kg    Gen: 43 y.o. male in no distress  Pulm: Non-labored breathing . Clear to auscultation bilaterally.  CV: Regular rate and rhythm. No murmur, rub, or gallop. No JVD, no pedal edema. GI: Abdomen soft, non-tender, non-distended, with normoactive bowel sounds. No organomegaly or masses felt. Ext: Warm, no deformities Skin: No rashes, lesions no ulcers Neuro: Alert and oriented. No focal neurological deficits. Psych: Judgement and insight appear normal. Mood & affect appropriate.   Data Reviewed: I have personally reviewed following labs and imaging studies  CBC: Recent Labs  Lab 01/28/19 1047 01/30/19 0158 01/31/19 0420 02/01/19 0448  WBC 10.0 8.7 5.4 5.2  NEUTROABS 8.1*  --   --   --   HGB 15.3 13.1 13.4 13.2  HCT 43.4 38.8* 40.1 39.0  MCV 84.4 87.8 87.7 87.2  PLT 205 226 250 265   Basic Metabolic Panel: Recent Labs  Lab 01/28/19 1047 01/29/19 0700  01/30/19 0158 01/31/19 0420 02/01/19 0448  NA 131* 137 136 136 136  K 3.1* 3.8 3.4* 3.4* 3.3*  CL 99 103 104 100 102  CO2 19* 25 18* 23 22  GLUCOSE 117* 104* 95 84 93  BUN 23* 19 20 15 12   CREATININE 1.24 1.12 1.07 0.90 0.81  CALCIUM 8.8* 9.1 8.6* 8.7* 8.7*   GFR: Estimated Creatinine Clearance: 154.5 mL/min (by C-G formula based on SCr of 0.81 mg/dL). Liver Function Tests: Recent Labs  Lab 01/28/19 1047 01/29/19 0700 01/30/19 0158 01/31/19 0420 02/01/19 0448  AST 32 43*  62* 171* 108*  ALT 33 37 47* 141* 152*  ALKPHOS 64 63 59 88 96  BILITOT 1.3* 0.7 0.6 0.6 1.0  PROT 8.0 7.7 7.1 7.2 7.2  ALBUMIN 3.9 3.4* 3.0* 2.9* 2.8*   No results for input(s): LIPASE, AMYLASE in the last 168 hours. No results for input(s): AMMONIA in the last 168 hours. Coagulation Profile: No results for input(s): INR, PROTIME in the last 168 hours. Cardiac Enzymes: No results for input(s): CKTOTAL, CKMB, CKMBINDEX, TROPONINI in the last 168 hours. BNP (last 3 results) No results for input(s): PROBNP in the last 8760 hours. HbA1C: No results for input(s): HGBA1C in the last 72 hours. CBG: No results for input(s): GLUCAP in the last 168 hours. Lipid Profile: No results for input(s): CHOL, HDL, LDLCALC, TRIG, CHOLHDL, LDLDIRECT in the last 72 hours. Thyroid Function Tests: No results for input(s): TSH, T4TOTAL, FREET4, T3FREE, THYROIDAB in the last 72 hours. Anemia Panel: Recent Labs    01/31/19 0420 02/01/19 0448  FERRITIN 1,462* 1,253*   Urine analysis:    Component Value Date/Time   COLORURINE AMBER (A) 01/28/2019 1401   APPEARANCEUR HAZY (A) 01/28/2019 1401   LABSPEC 1.031 (H) 01/28/2019 1401   PHURINE 5.0 01/28/2019 1401   GLUCOSEU NEGATIVE 01/28/2019 1401   HGBUR SMALL (A) 01/28/2019 1401   BILIRUBINUR NEGATIVE 01/28/2019 1401   KETONESUR 5 (A) 01/28/2019 1401   PROTEINUR 30 (A) 01/28/2019 1401   NITRITE NEGATIVE 01/28/2019 1401   LEUKOCYTESUR NEGATIVE 01/28/2019 1401   No results found for this or any previous visit (from the past 240 hour(s)).    Radiology Studies: No results found.  Scheduled Meds: . enoxaparin (LOVENOX) injection  40 mg Subcutaneous Q24H  . loratadine  10 mg Oral Daily  . montelukast  10 mg Oral Daily  . pantoprazole  40 mg Oral Daily  . vitamin C  500 mg Oral Daily  . zinc sulfate  220 mg Oral Daily   Continuous Infusions:   LOS: 3 days   Time spent: 35 minutes.  Patrecia Pour, MD [facility] [email or web address] Password  [email or web address] 02/01/2019, 1:05 PM

## 2019-02-01 NOTE — Plan of Care (Signed)
  Problem: Education: Goal: Knowledge of risk factors and measures for prevention of condition will improve Outcome: Progressing   Problem: Coping: Goal: Psychosocial and spiritual needs will be supported Outcome: Progressing   Problem: Respiratory: Goal: Will maintain a patent airway Outcome: Progressing Goal: Complications related to the disease process, condition or treatment will be avoided or minimized Outcome: Progressing   Problem: Education: Goal: Knowledge of General Education information will improve Description: Including pain rating scale, medication(s)/side effects and non-pharmacologic comfort measures Outcome: Progressing   Problem: Health Behavior/Discharge Planning: Goal: Ability to manage health-related needs will improve Outcome: Progressing   Problem: Clinical Measurements: Goal: Ability to maintain clinical measurements within normal limits will improve Outcome: Progressing Goal: Will remain free from infection Outcome: Progressing Goal: Diagnostic test results will improve Outcome: Progressing Goal: Respiratory complications will improve Outcome: Progressing Goal: Cardiovascular complication will be avoided Outcome: Progressing   Problem: Activity: Goal: Risk for activity intolerance will decrease Outcome: Progressing   Problem: Nutrition: Goal: Adequate nutrition will be maintained Outcome: Progressing   Problem: Coping: Goal: Level of anxiety will decrease Outcome: Progressing   Problem: Elimination: Goal: Will not experience complications related to bowel motility Outcome: Progressing Goal: Will not experience complications related to urinary retention Outcome: Progressing   Problem: Pain Managment: Goal: General experience of comfort will improve Outcome: Progressing   Problem: Safety: Goal: Ability to remain free from injury will improve Outcome: Progressing   Problem: Skin Integrity: Goal: Risk for impaired skin integrity will  decrease Outcome: Progressing   Problem: Education: Goal: Knowledge of General Education information will improve Description: Including pain rating scale, medication(s)/side effects and non-pharmacologic comfort measures Outcome: Progressing   Problem: Health Behavior/Discharge Planning: Goal: Ability to manage health-related needs will improve Outcome: Progressing   Problem: Clinical Measurements: Goal: Ability to maintain clinical measurements within normal limits will improve Outcome: Progressing Goal: Will remain free from infection Outcome: Progressing Goal: Diagnostic test results will improve Outcome: Progressing Goal: Respiratory complications will improve Outcome: Progressing Goal: Cardiovascular complication will be avoided Outcome: Progressing   Problem: Activity: Goal: Risk for activity intolerance will decrease Outcome: Progressing   Problem: Nutrition: Goal: Adequate nutrition will be maintained Outcome: Progressing   Problem: Coping: Goal: Level of anxiety will decrease Outcome: Progressing   Problem: Elimination: Goal: Will not experience complications related to bowel motility Outcome: Progressing Goal: Will not experience complications related to urinary retention Outcome: Progressing   Problem: Pain Managment: Goal: General experience of comfort will improve Outcome: Progressing   Problem: Safety: Goal: Ability to remain free from injury will improve Outcome: Progressing   Problem: Skin Integrity: Goal: Risk for impaired skin integrity will decrease Outcome: Progressing   Problem: Education: Goal: Knowledge of risk factors and measures for prevention of condition will improve Outcome: Progressing   Problem: Coping: Goal: Psychosocial and spiritual needs will be supported Outcome: Progressing   Problem: Respiratory: Goal: Will maintain a patent airway Outcome: Progressing Goal: Complications related to the disease process, condition  or treatment will be avoided or minimized Outcome: Progressing   

## 2019-02-02 ENCOUNTER — Encounter (INDEPENDENT_AMBULATORY_CARE_PROVIDER_SITE_OTHER): Payer: Self-pay

## 2019-02-02 LAB — COMPREHENSIVE METABOLIC PANEL
ALT: 144 U/L — ABNORMAL HIGH (ref 0–44)
AST: 78 U/L — ABNORMAL HIGH (ref 15–41)
Albumin: 2.8 g/dL — ABNORMAL LOW (ref 3.5–5.0)
Alkaline Phosphatase: 97 U/L (ref 38–126)
Anion gap: 10 (ref 5–15)
BUN: 11 mg/dL (ref 6–20)
CO2: 25 mmol/L (ref 22–32)
Calcium: 9 mg/dL (ref 8.9–10.3)
Chloride: 103 mmol/L (ref 98–111)
Creatinine, Ser: 0.73 mg/dL (ref 0.61–1.24)
GFR calc Af Amer: >60 mL/min (ref >60)
GFR calc non Af Amer: >60 mL/min (ref >60)
Glucose, Bld: 93 mg/dL (ref 70–99)
Potassium: 3.5 mmol/L (ref 3.5–5.1)
Sodium: 138 mmol/L (ref 135–145)
Total Bilirubin: 0.8 mg/dL (ref 0.3–1.2)
Total Protein: 7.2 g/dL (ref 6.5–8.1)

## 2019-02-02 LAB — CBC
HCT: 38.4 % — ABNORMAL LOW (ref 39.0–52.0)
Hemoglobin: 13.4 g/dL (ref 13.0–17.0)
MCH: 30.4 pg (ref 26.0–34.0)
MCHC: 34.9 g/dL (ref 30.0–36.0)
MCV: 87.1 fL (ref 80.0–100.0)
Platelets: 286 10*3/uL (ref 150–400)
RBC: 4.41 MIL/uL (ref 4.22–5.81)
RDW: 12.4 % (ref 11.5–15.5)
WBC: 4.6 10*3/uL (ref 4.0–10.5)
nRBC: 0 % (ref 0.0–0.2)

## 2019-02-02 LAB — FERRITIN: Ferritin: 1021 ng/mL — ABNORMAL HIGH (ref 24–336)

## 2019-02-02 LAB — C-REACTIVE PROTEIN: CRP: 14.6 mg/dL — ABNORMAL HIGH (ref <1.0)

## 2019-02-02 LAB — D-DIMER, QUANTITATIVE: D-Dimer, Quant: 0.54 ug/mL-FEU — ABNORMAL HIGH (ref 0.00–0.50)

## 2019-02-02 NOTE — Progress Notes (Signed)
Pt discharging home with no needs at present time.  

## 2019-02-02 NOTE — Discharge Summary (Signed)
Physician Discharge Summary  Victor Blanchard FBP:[id number] DOB: [birth year] DOA: 01/28/2019  PCP: Dione Housekeeper, MD  Admit date: 01/28/2019 Discharge date: 02/02/2019  Admitted From: Home Disposition: Home   Recommendations for Outpatient Follow-up:  1. Follow up with PCP in 1-2 weeks 2. Please obtain CMP at follow up and monitor for alcohol moderation/cessation.  Home Health: None Equipment/Devices: None Discharge Condition: Stable, improved CODE STATUS: Full Diet recommendation: Regular  Brief/Interim Summary: Victor Blanchard is a 43 y.o. male with a history of alcohol abuse who presented with several days of fever worsening since diagnosis with covid on 6/10. He had since developed body aches, chills, vomiting and diarrhea, unable to maintain sufficient per oral intake. On arrival he was febrile with tachypnea, elevated inflammatory markers, and peripheral CXR opacities. With supportive care, diarrhea and other GI symptoms resolved, per oral intake improved and respiratory status was stable. On the day prior to discharge he ambulated without oxygen without significant dyspnea or hypoxia. There was no evidence of alcohol withdrawal, though LFTs trended upward initially and have peaked. These will need to be rechecked, though he is stable for discharge home.   Discharge Diagnoses:  Active Problems:   Acute respiratory disease due to COVID-19 virus  Acute respiratory disease due to covid-19 pneumonia: Based on time of symptoms he is >2 weeks into infection and thus at low risk of worsening at this time. Inflammatory markers continue to spontaneously decline. No hypoxia on ambulatory pulse oximetry.  - Recommend isolation x2 weeks and/or per CDC guidelines.  - Avoid NSAIDs - Consider convalescent plasma donation - Recommend to continue proning and aggressive use of incentive spirometry.   LFT elevation: Mild, peaked 6/24 at AST 108, ALT 152, and down from that time. Bilirubin and alkaline  phosphatase normal (see below)  Asthma: Continue singulair, prn albuterol MDI  Alcohol abuse: ~6 cans of beer/day, last drink 6/16 due to vomiting. No evidence of withdrawal.  - Moderation recommended   Hypokalemia: Resolved.  Hyponatremia: Mild, resolved.  NAGMA: Due to GI losses, resolved.  Discharge Instructions Discharge Instructions    Discharge instructions   Complete by: As directed    You are being discharged from the hospital after treatment for covid-19 infection. You are being discharged based on the standards of national guidelines (body temperature remaining normal for 3 days, symptoms significantly improved) - You are felt to be stable enough to no longer require inpatient monitoring, testing, and treatment, though you will need to follow the recommendations below: - It is recommended that you continue self isolation for 2 weeks.  - Do not take NSAID medications (including, but not limited to, ibuprofen, advil, motrin, naproxen, aleve, goody's powder, etc.) - Follow up with your doctor in the next week via telehealth or seek medical attention right away if your symptoms get WORSE.  - Consider donating plasma after you have recovered (either 14 days after a negative test or 28 days after symptoms have completely resolved) because your antibodies to this virus may be helpful to give to others with life-threatening infections. Please go to the website [email or web address] if you would like to consider volunteering for plasma donation.    Directions for you at home:  Wear a facemask You should wear a facemask that covers your nose and mouth when you are in the same room with other people and when you visit a healthcare provider. People who live with or visit you should also wear a facemask while they are in the same room with  you.  Separate yourself from other people in your home As much as possible, you should stay in a different room from other people in your home. Also,  you should use a separate bathroom, if available.  Avoid sharing household items You should not share dishes, drinking glasses, cups, eating utensils, towels, bedding, or other items with other people in your home. After using these items, you should wash them thoroughly with soap and water.  Cover your coughs and sneezes Cover your mouth and nose with a tissue when you cough or sneeze, or you can cough or sneeze into your sleeve. Throw used tissues in a lined trash can, and immediately wash your hands with soap and water for at least 20 seconds or use an alcohol-based hand rub.  Wash your [employer] Corporationhands Wash your hands often and thoroughly with soap and water for at least 20 seconds. You can use an alcohol-based hand sanitizer if soap and water are not available and if your hands are not visibly dirty. Avoid touching your eyes, nose, and mouth with unwashed hands.  Directions for those who live with, or provide care at home for you:  Limit the number of people who have contact with the patient If possible, have only one caregiver for the patient. Other household members should stay in another home or place of residence. If this is not possible, they should stay in another room, or be separated from the patient as much as possible. Use a separate bathroom, if available. Restrict visitors who do not have an essential need to be in the home.  Ensure good ventilation Make sure that shared spaces in the home have good air flow, such as from an air conditioner or an opened window, weather permitting.  Wash your hands often Wash your hands often and thoroughly with soap and water for at least 20 seconds. You can use an alcohol based hand sanitizer if soap and water are not available and if your hands are not visibly dirty. Avoid touching your eyes, nose, and mouth with unwashed hands. Use disposable paper towels to dry your hands. If not available, use dedicated cloth towels and replace them when  they become wet.  Wear a facemask and gloves Wear a disposable facemask at all times in the room and gloves when you touch or have contact with the patient's blood, body fluids, and/or secretions or excretions, such as sweat, saliva, sputum, nasal mucus, vomit, urine, or feces.  Ensure the mask fits over your nose and mouth tightly, and do not touch it during use. Throw out disposable facemasks and gloves after using them. Do not reuse. Wash your hands immediately after removing your facemask and gloves. If your personal clothing becomes contaminated, carefully remove clothing and launder. Wash your hands after handling contaminated clothing. Place all used disposable facemasks, gloves, and other waste in a lined container before disposing them with other household waste. Remove gloves and wash your hands immediately after handling these items.  Do not share dishes, glasses, or other household items with the patient Avoid sharing household items. You should not share dishes, drinking glasses, cups, eating utensils, towels, bedding, or other items with a patient who is confirmed to have, or being evaluated for, COVID-19 infection. After the person uses these items, you should wash them thoroughly with soap and water.  Wash laundry thoroughly Immediately remove and wash clothes or bedding that have blood, body fluids, and/or secretions or excretions, such as sweat, saliva, sputum, nasal mucus, vomit, urine, or  feces, on them. Wear gloves when handling laundry from the patient. Read and follow directions on labels of laundry or clothing items and detergent. In general, wash and dry with the warmest temperatures recommended on the label.  Clean all areas the individual has used often Clean all touchable surfaces, such as counters, tabletops, doorknobs, bathroom fixtures, toilets, phones, keyboards, tablets, and bedside tables, every day. Also, clean any surfaces that may have blood, body fluids,  and/or secretions or excretions on them. Wear gloves when cleaning surfaces the patient has come in contact with. Use a diluted bleach solution (e.g., dilute bleach with 1 part bleach and 10 parts water) or a household disinfectant with a label that says EPA-registered for coronaviruses. To make a bleach solution at home, add 1 tablespoon of bleach to 1 quart (4 cups) of water. For a larger supply, add  cup of bleach to 1 gallon (16 cups) of water. Read labels of cleaning products and follow recommendations provided on product labels. Labels contain instructions for safe and effective use of the cleaning product including precautions you should take when applying the product, such as wearing gloves or eye protection and making sure you have good ventilation during use of the product. Remove gloves and wash hands immediately after cleaning.  Monitor yourself for signs and symptoms of illness Caregivers and household members are considered close contacts, should monitor their health, and will be asked to limit movement outside of the home to the extent possible. Follow the monitoring steps for close contacts listed on the symptom monitoring form.   If you have additional questions, contact your local health department or call the epidemiologist on call at [phone] (available 24/7). This guidance is subject to change. For the most up-to-date guidance from [facility], please refer to their website: TripMetro.huhttps://www.cdc.gov/coronavirus/2019-ncov/hcp/guidance-prevent-spread.html   MyChart COVID-19 home monitoring program   Complete by: Feb 02, 2019    Is the patient willing to use the MyChart Mobile App for home monitoring?: Yes   Temperature monitoring   Complete by: Feb 02, 2019    After how many days would you like to receive a notification of this patient's flowsheet entries?: 1     Allergies as of 02/02/2019      Reactions   Morphine And Related Nausea And Vomiting      Medication List    STOP  taking these medications   doxycycline 100 MG capsule Commonly known as: VIBRAMYCIN     TAKE these medications   dicyclomine 20 MG tablet Commonly known as: BENTYL TAKE 1 TABLET FOUR TIMES A DAY 30 MINUTES BEFORE A MEAL   loratadine 10 MG tablet Commonly known as: CLARITIN Take 10 mg by mouth daily.   montelukast 10 MG tablet Commonly known as: SINGULAIR TAKE 1 TABLET BY MOUTH EVERY DAY   omeprazole 20 MG capsule Commonly known as: PRILOSEC TAKE 1 CAPSULE BY MOUTH EVERY DAY   Tylenol 8 Hour Arthritis Pain 650 MG CR tablet Generic drug: acetaminophen Take 1,300 mg by mouth every 8 (eight) hours as needed for pain.      Follow-up Information    Joette CatchingNyland, Leonard, MD. Schedule an appointment as soon as possible for a visit in 1 week(s).   Specialty: Family Medicine Contact information: [address] [place] [id number] [phone]          Allergies  Allergen Reactions  . Morphine And Related Nausea And Vomiting    Consultations:  None  Procedures/Studies: Dg Chest Portable 1 View  Result Date:  01/28/2019 CLINICAL DATA:  COVID-19 positive.  Increased shortness of breath. EXAM: PORTABLE CHEST 1 VIEW COMPARISON:  01/18/2019 FINDINGS: Grossly unchanged cardiac silhouette and mediastinal contours with persistent thickening of the right paratracheal stripe. Interval development heterogeneous airspace opacity with the peripheral aspect the right upper lung. Ill-defined nodular airspace opacities are seen with the peripheral aspect of the left mid and lower lung. No pleural effusion or pneumothorax. No evidence of edema. No acute osseus abnormalities. IMPRESSION: Findings worrisome for development of multifocal infection most severely affecting the peripheral aspect of the right upper lung, compatible with provided history of patient's COVID-19 positivity. Electronically Signed   By: Simonne ComeJohn  Watts M.D.   On: 01/28/2019 13:14   Dg Chest Portable 1 View  Result Date:  01/18/2019 CLINICAL DATA:  Chills and body aches EXAM: PORTABLE CHEST 1 VIEW COMPARISON:  None. FINDINGS: The heart size and mediastinal contours are within normal limits. Both lungs are clear. The visualized skeletal structures are unremarkable. IMPRESSION: No active disease. Electronically Signed   By: Katherine Mantlehristopher  Green M.D.   On: 01/18/2019 20:55    Subjective: Slept ok, breathing is nearly normal, no dyspnea or chest pain currently. Diarrhea has improved and he's tolerating a diet. Ok with discharge today.   Discharge Exam: Vitals:   02/02/19 0420 02/02/19 0928  BP: 110/81 114/88  Pulse:  88  Resp:    Temp: 99.7 F (37.6 C) 98.1 F (36.7 C)  SpO2:  94%   General: Pt is alert, awake, not in acute distress Cardiovascular: RRR, S1/S2 +, no rubs, no gallops Respiratory: CTA bilaterally, no wheezing, no rhonchi Abdominal: Soft, NT, ND, bowel sounds + Extremities: No edema, no cyanosis  Labs: BNP (last 3 results) No results for input(s): BNP in the last 8760 hours. Basic Metabolic Panel: Recent Labs  Lab 01/29/19 0700 01/30/19 0158 01/31/19 0420 02/01/19 0448 02/02/19 0444  NA 137 136 136 136 138  K 3.8 3.4* 3.4* 3.3* 3.5  CL 103 104 100 102 103  CO2 25 18* 23 22 25   GLUCOSE 104* 95 84 93 93  BUN 19 20 15 12 11   CREATININE 1.12 1.07 0.90 0.81 0.73  CALCIUM 9.1 8.6* 8.7* 8.7* 9.0   Liver Function Tests: Recent Labs  Lab 01/29/19 0700 01/30/19 0158 01/31/19 0420 02/01/19 0448 02/02/19 0444  AST 43* 62* 171* 108* 78*  ALT 37 47* 141* 152* 144*  ALKPHOS 63 59 88 96 97  BILITOT 0.7 0.6 0.6 1.0 0.8  PROT 7.7 7.1 7.2 7.2 7.2  ALBUMIN 3.4* 3.0* 2.9* 2.8* 2.8*   No results for input(s): LIPASE, AMYLASE in the last 168 hours. No results for input(s): AMMONIA in the last 168 hours. CBC: Recent Labs  Lab 01/28/19 1047 01/30/19 0158 01/31/19 0420 02/01/19 0448 02/02/19 0444  WBC 10.0 8.7 5.4 5.2 4.6  NEUTROABS 8.1*  --   --   --   --   HGB 15.3 13.1 13.4 13.2  13.4  HCT 43.4 38.8* 40.1 39.0 38.4*  MCV 84.4 87.8 87.7 87.2 87.1  PLT 205 226 250 265 286   Cardiac Enzymes: No results for input(s): CKTOTAL, CKMB, CKMBINDEX, TROPONINI in the last 168 hours. BNP: Invalid input(s): POCBNP CBG: No results for input(s): GLUCAP in the last 168 hours. D-Dimer Recent Labs    02/01/19 0448 02/02/19 0444  DDIMER 0.62* 0.54*   Hgb A1c No results for input(s): HGBA1C in the last 72 hours. Lipid Profile No results for input(s): CHOL, HDL, LDLCALC, TRIG, CHOLHDL, LDLDIRECT in  the last 72 hours. Thyroid function studies No results for input(s): TSH, T4TOTAL, T3FREE, THYROIDAB in the last 72 hours.  Invalid input(s): FREET3 Anemia work up Recent Labs    02/01/19 0448 02/02/19 0444  FERRITIN 1,253* 1,021*   Urinalysis    Component Value Date/Time   COLORURINE AMBER (A) 01/28/2019 1401   APPEARANCEUR HAZY (A) 01/28/2019 1401   LABSPEC 1.031 (H) 01/28/2019 1401   PHURINE 5.0 01/28/2019 1401   GLUCOSEU NEGATIVE 01/28/2019 1401   HGBUR SMALL (A) 01/28/2019 1401   BILIRUBINUR NEGATIVE 01/28/2019 1401   KETONESUR 5 (A) 01/28/2019 1401   PROTEINUR 30 (A) 01/28/2019 1401   NITRITE NEGATIVE 01/28/2019 1401   LEUKOCYTESUR NEGATIVE 01/28/2019 1401    Microbiology No results found for this or any previous visit (from the past 240 hour(s)).  Time coordinating discharge: Approximately 40 minutes  Tyrone Nine, MD  [facility] 02/02/2019, 9:33 AM

## 2019-02-02 NOTE — Progress Notes (Signed)
Patient given discharge instructions, and verbalized an understanding of all discharge instructions.  Patient agrees with discharge plan, and is being discharged in stable medical condition.  Patient given transportation via wheelchair. 

## 2019-02-02 NOTE — Progress Notes (Signed)
Pt discharging with no HH needs at present time. 

## 2019-02-03 ENCOUNTER — Encounter (INDEPENDENT_AMBULATORY_CARE_PROVIDER_SITE_OTHER): Payer: Self-pay

## 2019-02-04 ENCOUNTER — Encounter (INDEPENDENT_AMBULATORY_CARE_PROVIDER_SITE_OTHER): Payer: Self-pay

## 2019-02-05 ENCOUNTER — Encounter (INDEPENDENT_AMBULATORY_CARE_PROVIDER_SITE_OTHER): Payer: Self-pay

## 2019-02-06 ENCOUNTER — Encounter (INDEPENDENT_AMBULATORY_CARE_PROVIDER_SITE_OTHER): Payer: Self-pay

## 2019-02-07 ENCOUNTER — Encounter (INDEPENDENT_AMBULATORY_CARE_PROVIDER_SITE_OTHER): Payer: Self-pay

## 2019-02-08 ENCOUNTER — Encounter (INDEPENDENT_AMBULATORY_CARE_PROVIDER_SITE_OTHER): Payer: Self-pay

## 2019-02-09 ENCOUNTER — Encounter (INDEPENDENT_AMBULATORY_CARE_PROVIDER_SITE_OTHER): Payer: Self-pay

## 2019-02-10 ENCOUNTER — Encounter (INDEPENDENT_AMBULATORY_CARE_PROVIDER_SITE_OTHER): Payer: Self-pay

## 2019-02-11 ENCOUNTER — Encounter (INDEPENDENT_AMBULATORY_CARE_PROVIDER_SITE_OTHER): Payer: Self-pay

## 2019-02-12 ENCOUNTER — Encounter (INDEPENDENT_AMBULATORY_CARE_PROVIDER_SITE_OTHER): Payer: Self-pay

## 2019-02-13 ENCOUNTER — Encounter (INDEPENDENT_AMBULATORY_CARE_PROVIDER_SITE_OTHER): Payer: Self-pay

## 2019-02-14 ENCOUNTER — Encounter (INDEPENDENT_AMBULATORY_CARE_PROVIDER_SITE_OTHER): Payer: Self-pay

## 2019-02-15 ENCOUNTER — Encounter (INDEPENDENT_AMBULATORY_CARE_PROVIDER_SITE_OTHER): Payer: Self-pay

## 2019-06-20 ENCOUNTER — Other Ambulatory Visit: Payer: Self-pay

## 2019-06-20 DIAGNOSIS — Z20822 Contact with and (suspected) exposure to covid-19: Secondary | ICD-10-CM

## 2019-06-21 LAB — NOVEL CORONAVIRUS, NAA: SARS-CoV-2, NAA: NOT DETECTED

## 2019-06-30 ENCOUNTER — Other Ambulatory Visit: Payer: Self-pay

## 2019-06-30 DIAGNOSIS — Z20822 Contact with and (suspected) exposure to covid-19: Secondary | ICD-10-CM

## 2019-07-02 ENCOUNTER — Telehealth: Payer: Self-pay

## 2019-07-02 NOTE — Telephone Encounter (Signed)
Pt called for covid test results. Advised results are not back.  

## 2019-07-03 LAB — NOVEL CORONAVIRUS, NAA: SARS-CoV-2, NAA: NOT DETECTED

## 2019-11-30 ENCOUNTER — Ambulatory Visit: Payer: BC Managed Care – PPO | Attending: Internal Medicine

## 2019-11-30 DIAGNOSIS — Z23 Encounter for immunization: Secondary | ICD-10-CM

## 2019-11-30 NOTE — Progress Notes (Signed)
   Covid-19 Vaccination Clinic  Name:  Victor Blanchard    MRN: [id number] DOB: 03/16/1976  11/30/2019  Mr. Solanki was observed post Covid-19 immunization for 15 minutes without incident. He was provided with Vaccine Information Sheet and instruction to access the V-Safe system.   Mr. Partin was instructed to call 911 with any severe reactions post vaccine: Marland Kitchen Difficulty breathing  . Swelling of face and throat  . A fast heartbeat  . A bad rash all over body  . Dizziness and weakness   Immunizations Administered    Name Date Dose VIS Date Route   Moderna COVID-19 Vaccine 11/30/2019  8:36 AM 0.5 mL 07/2019 Intramuscular   Manufacturer: Moderna   Lot: [id number]   NDC: 99718-209-90

## 2019-12-11 IMAGING — DX PORTABLE CHEST - 1 VIEW
1 series · 1 of 1 positions shown · non-contrast
Comparison: None.

CLINICAL DATA: Chills and body aches

EXAM:
PORTABLE CHEST 1 VIEW

[chest ap]
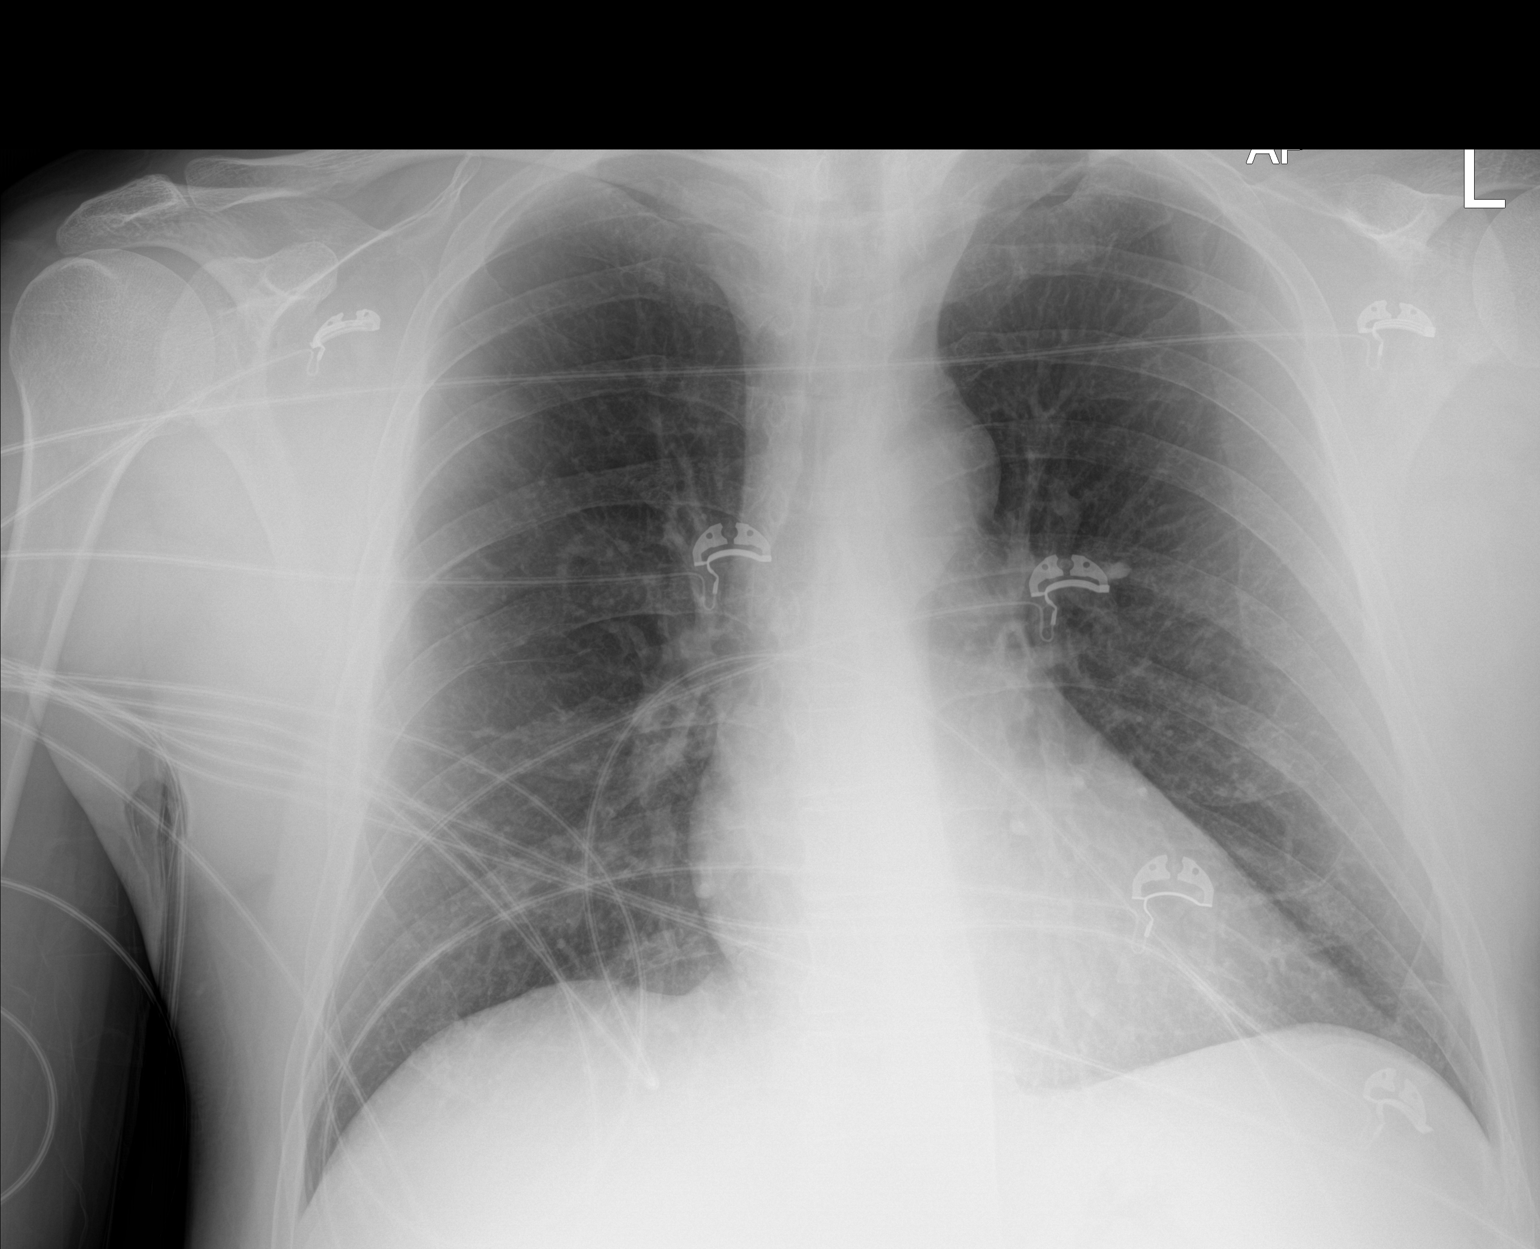

[1 of 1 positions shown; findings below may reference images not displayed]

FINDINGS: The heart size and mediastinal contours are within normal limits.
Both lungs are clear. The visualized skeletal structures are
unremarkable.
IMPRESSION: No active disease.

## 2019-12-21 IMAGING — CR PORTABLE CHEST - 1 VIEW
1 series · 2 of 2 positions shown · non-contrast
Comparison: 01/18/2019

CLINICAL DATA: 2DD85-IE positive.  Increased shortness of breath.

EXAM:
PORTABLE CHEST 1 VIEW

[Series 1: portable · 0.17mm/px · 2 of 2 slices shown]
[im 1/2]
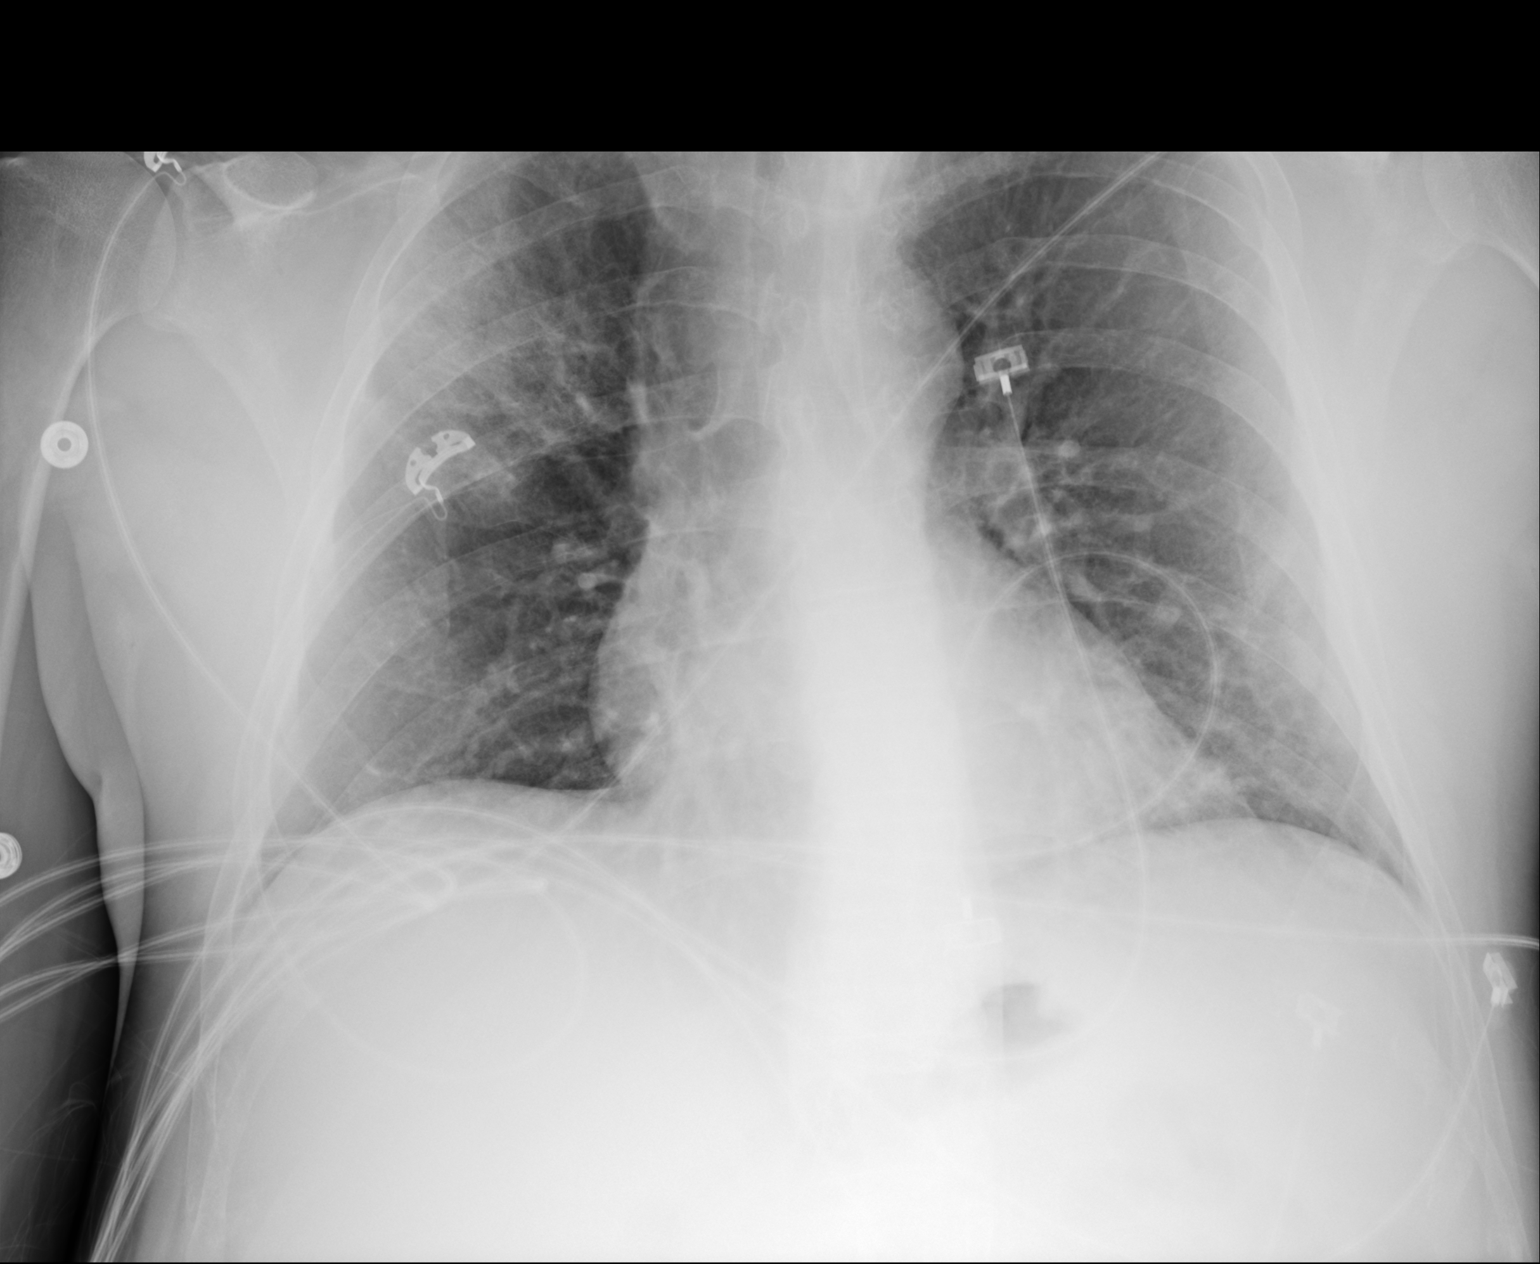
[im 2/2]
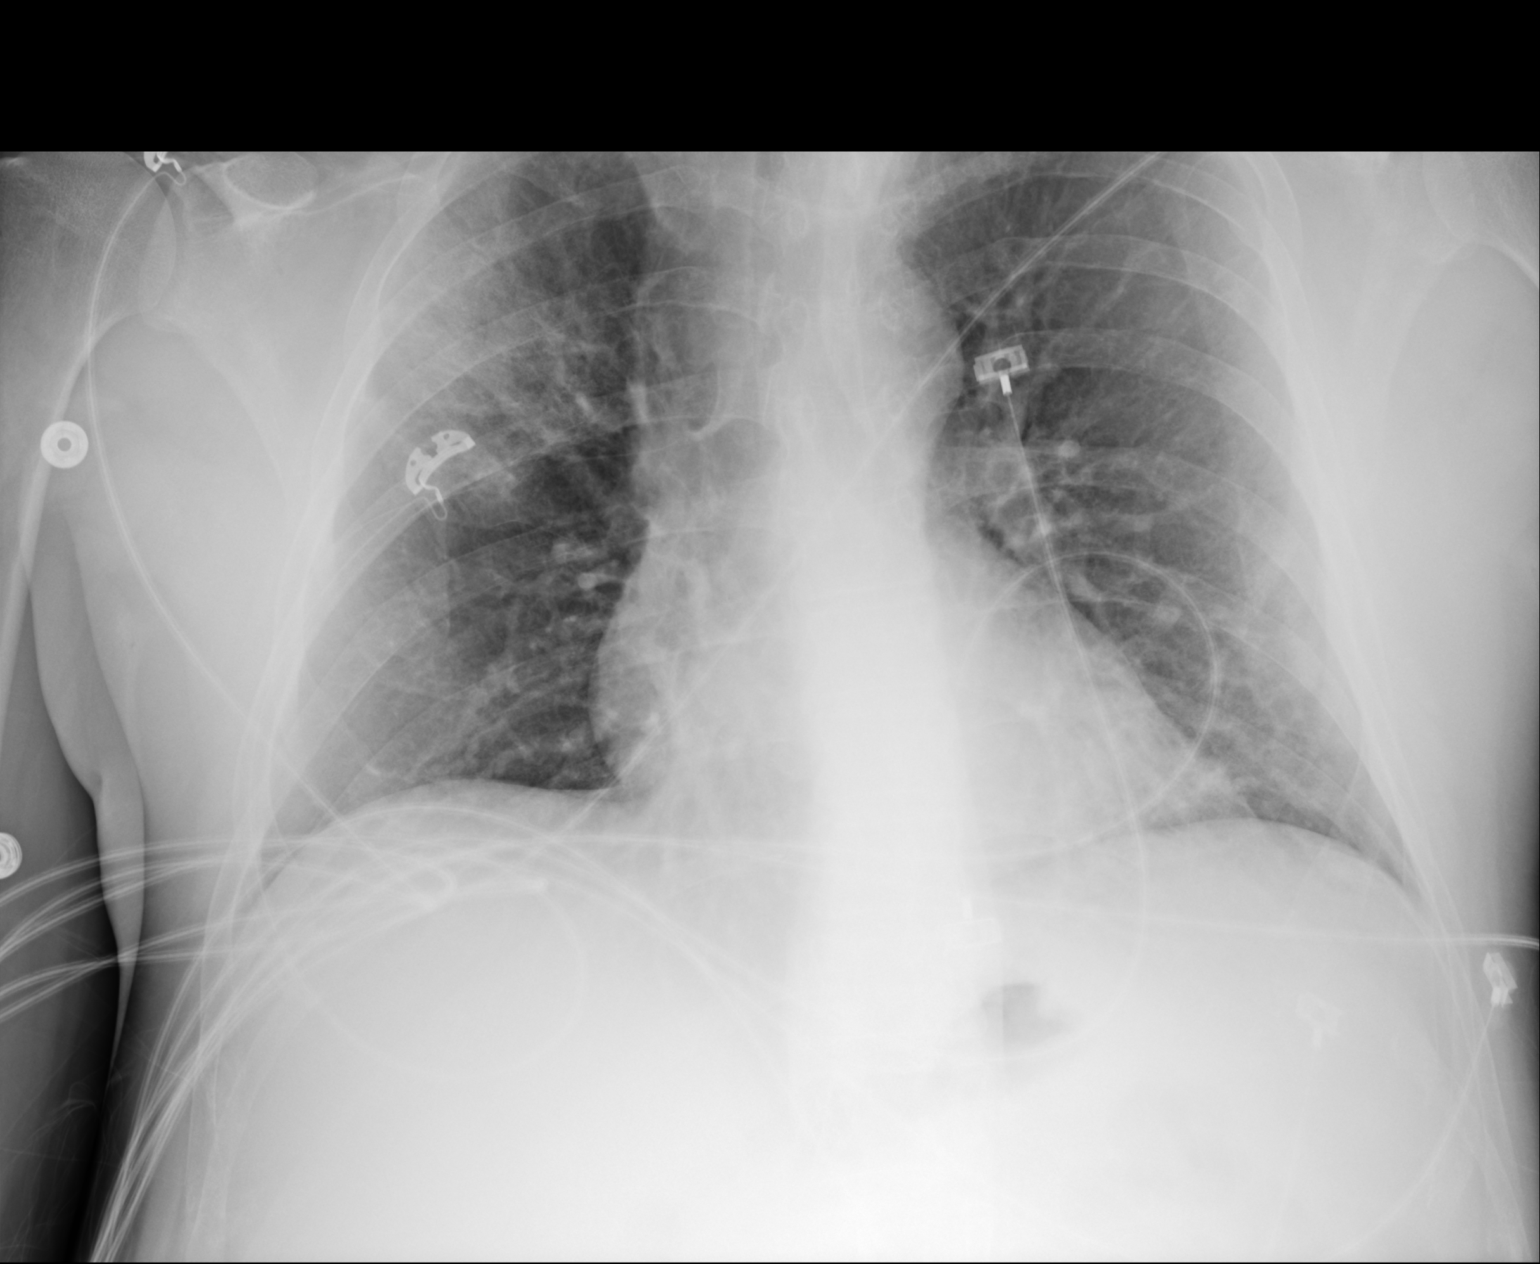

[2 of 2 positions shown; findings below may reference images not displayed]

FINDINGS: Grossly unchanged cardiac silhouette and mediastinal contours with
persistent thickening of the right paratracheal stripe.

Interval development heterogeneous airspace opacity with the
peripheral aspect the right upper lung. Ill-defined nodular airspace
opacities are seen with the peripheral aspect of the left mid and
lower lung. No pleural effusion or pneumothorax. No evidence of
edema. No acute osseus abnormalities.
IMPRESSION: Findings worrisome for development of multifocal infection most
severely affecting the peripheral aspect of the right upper lung,
compatible with provided history of patient's 2DD85-IE positivity.

## 2020-01-02 ENCOUNTER — Ambulatory Visit: Payer: BC Managed Care – PPO

## 2020-01-02 ENCOUNTER — Ambulatory Visit: Payer: BC Managed Care – PPO | Attending: Internal Medicine

## 2020-01-02 DIAGNOSIS — Z23 Encounter for immunization: Secondary | ICD-10-CM

## 2020-01-02 NOTE — Progress Notes (Signed)
   Covid-19 Vaccination Clinic  Name:  Victor Blanchard    MRN: [id number] DOB: 08-19-1975  01/02/2020  Mr. Borowiak was observed post Covid-19 immunization for 15 minutes without incident. He was provided with Vaccine Information Sheet and instruction to access the V-Safe system.   Mr. Gales was instructed to call 911 with any severe reactions post vaccine: Marland Kitchen Difficulty breathing  . Swelling of face and throat  . A fast heartbeat  . A bad rash all over body  . Dizziness and weakness   Immunizations Administered    Name Date Dose VIS Date Route   Moderna COVID-19 Vaccine 01/02/2020  9:21 AM 0.5 mL 07/2019 Intramuscular   Manufacturer: Moderna   Lot: [id number]   NDC: 15488-457-33

## 2024-07-21 DIAGNOSIS — Z419 Encounter for procedure for purposes other than remedying health state, unspecified: Secondary | ICD-10-CM | POA: Diagnosis not present
